# Patient Record
Sex: Female | Born: 1940 | Race: White | Hispanic: No | Marital: Married | Smoking: Former smoker
Health system: Southern US, Community
[De-identification: ages and names within clinical notes are randomized; demographics above are authoritative.]

## PROBLEM LIST (undated history)

## (undated) DIAGNOSIS — M4310 Spondylolisthesis, site unspecified: Secondary | ICD-10-CM

## (undated) DIAGNOSIS — F419 Anxiety disorder, unspecified: Secondary | ICD-10-CM

## (undated) DIAGNOSIS — M419 Scoliosis, unspecified: Secondary | ICD-10-CM

## (undated) DIAGNOSIS — K649 Unspecified hemorrhoids: Secondary | ICD-10-CM

## (undated) DIAGNOSIS — K449 Diaphragmatic hernia without obstruction or gangrene: Secondary | ICD-10-CM

## (undated) DIAGNOSIS — H269 Unspecified cataract: Secondary | ICD-10-CM

## (undated) DIAGNOSIS — K635 Polyp of colon: Secondary | ICD-10-CM

## (undated) DIAGNOSIS — M797 Fibromyalgia: Secondary | ICD-10-CM

## (undated) DIAGNOSIS — D126 Benign neoplasm of colon, unspecified: Secondary | ICD-10-CM

## (undated) DIAGNOSIS — T7840XA Allergy, unspecified, initial encounter: Secondary | ICD-10-CM

## (undated) DIAGNOSIS — M9901 Segmental and somatic dysfunction of cervical region: Secondary | ICD-10-CM

## (undated) DIAGNOSIS — M7750 Other enthesopathy of unspecified foot: Secondary | ICD-10-CM

## (undated) DIAGNOSIS — N3010 Interstitial cystitis (chronic) without hematuria: Secondary | ICD-10-CM

## (undated) DIAGNOSIS — E039 Hypothyroidism, unspecified: Secondary | ICD-10-CM

## (undated) DIAGNOSIS — K219 Gastro-esophageal reflux disease without esophagitis: Secondary | ICD-10-CM

## (undated) DIAGNOSIS — R002 Palpitations: Secondary | ICD-10-CM

## (undated) DIAGNOSIS — I10 Essential (primary) hypertension: Secondary | ICD-10-CM

## (undated) DIAGNOSIS — K589 Irritable bowel syndrome without diarrhea: Secondary | ICD-10-CM

## (undated) DIAGNOSIS — M810 Age-related osteoporosis without current pathological fracture: Secondary | ICD-10-CM

## (undated) DIAGNOSIS — E785 Hyperlipidemia, unspecified: Secondary | ICD-10-CM

## (undated) DIAGNOSIS — M479 Spondylosis, unspecified: Secondary | ICD-10-CM

## (undated) DIAGNOSIS — R000 Tachycardia, unspecified: Secondary | ICD-10-CM

## (undated) DIAGNOSIS — H8110 Benign paroxysmal vertigo, unspecified ear: Secondary | ICD-10-CM

## (undated) HISTORY — PX: DILATION AND CURETTAGE OF UTERUS: SHX78

## (undated) HISTORY — DX: Anxiety disorder, unspecified: F41.9

## (undated) HISTORY — PX: TUBAL LIGATION: SHX77

## (undated) HISTORY — DX: Polyp of colon: K63.5

## (undated) HISTORY — PX: VEIN SURGERY: SHX48

## (undated) HISTORY — DX: Spondylolisthesis, site unspecified: M43.10

## (undated) HISTORY — DX: Age-related osteoporosis without current pathological fracture: M81.0

## (undated) HISTORY — DX: Unspecified cataract: H26.9

## (undated) HISTORY — PX: UPPER GASTROINTESTINAL ENDOSCOPY: SHX188

## (undated) HISTORY — PX: POLYPECTOMY: SHX149

## (undated) HISTORY — DX: Palpitations: R00.2

## (undated) HISTORY — DX: Scoliosis, unspecified: M41.9

## (undated) HISTORY — DX: Diaphragmatic hernia without obstruction or gangrene: K44.9

## (undated) HISTORY — DX: Unspecified hemorrhoids: K64.9

## (undated) HISTORY — DX: Gastro-esophageal reflux disease without esophagitis: K21.9

## (undated) HISTORY — DX: Segmental and somatic dysfunction of cervical region: M99.01

## (undated) HISTORY — DX: Allergy, unspecified, initial encounter: T78.40XA

## (undated) HISTORY — DX: Irritable bowel syndrome, unspecified: K58.9

## (undated) HISTORY — DX: Other enthesopathy of unspecified foot and ankle: M77.50

## (undated) HISTORY — DX: Benign neoplasm of colon, unspecified: D12.6

## (undated) HISTORY — DX: Fibromyalgia: M79.7

## (undated) HISTORY — PX: TONSILLECTOMY: SUR1361

---

## 1998-02-17 ENCOUNTER — Emergency Department (HOSPITAL_COMMUNITY): Admission: EM | Admit: 1998-02-17 | Discharge: 1998-02-17 | Payer: Self-pay | Admitting: Emergency Medicine

## 1998-02-18 ENCOUNTER — Encounter: Payer: Self-pay | Admitting: Emergency Medicine

## 1998-08-31 ENCOUNTER — Other Ambulatory Visit: Admission: RE | Admit: 1998-08-31 | Discharge: 1998-08-31 | Payer: Self-pay | Admitting: Gynecology

## 1998-12-14 ENCOUNTER — Emergency Department (HOSPITAL_COMMUNITY): Admission: EM | Admit: 1998-12-14 | Discharge: 1998-12-14 | Payer: Self-pay | Admitting: Emergency Medicine

## 1999-09-03 ENCOUNTER — Other Ambulatory Visit: Admission: RE | Admit: 1999-09-03 | Discharge: 1999-09-03 | Payer: Self-pay | Admitting: Gynecology

## 2000-09-03 ENCOUNTER — Other Ambulatory Visit: Admission: RE | Admit: 2000-09-03 | Discharge: 2000-09-03 | Payer: Self-pay | Admitting: Gynecology

## 2001-01-28 ENCOUNTER — Other Ambulatory Visit: Admission: RE | Admit: 2001-01-28 | Discharge: 2001-01-28 | Payer: Self-pay | Admitting: Gynecology

## 2001-03-23 ENCOUNTER — Encounter: Payer: Self-pay | Admitting: Internal Medicine

## 2001-03-23 ENCOUNTER — Encounter (INDEPENDENT_AMBULATORY_CARE_PROVIDER_SITE_OTHER): Payer: Self-pay

## 2001-03-23 ENCOUNTER — Other Ambulatory Visit: Admission: RE | Admit: 2001-03-23 | Discharge: 2001-03-23 | Payer: Self-pay | Admitting: Internal Medicine

## 2001-09-07 ENCOUNTER — Other Ambulatory Visit: Admission: RE | Admit: 2001-09-07 | Discharge: 2001-09-07 | Payer: Self-pay | Admitting: Gynecology

## 2001-12-28 ENCOUNTER — Other Ambulatory Visit: Admission: RE | Admit: 2001-12-28 | Discharge: 2001-12-28 | Payer: Self-pay | Admitting: Gynecology

## 2002-09-15 ENCOUNTER — Other Ambulatory Visit: Admission: RE | Admit: 2002-09-15 | Discharge: 2002-09-15 | Payer: Self-pay | Admitting: Gynecology

## 2003-05-11 ENCOUNTER — Encounter: Payer: Self-pay | Admitting: Internal Medicine

## 2003-09-20 ENCOUNTER — Other Ambulatory Visit: Admission: RE | Admit: 2003-09-20 | Discharge: 2003-09-20 | Payer: Self-pay | Admitting: Gynecology

## 2004-09-20 ENCOUNTER — Other Ambulatory Visit: Admission: RE | Admit: 2004-09-20 | Discharge: 2004-09-20 | Payer: Self-pay | Admitting: Gynecology

## 2005-09-24 ENCOUNTER — Other Ambulatory Visit: Admission: RE | Admit: 2005-09-24 | Discharge: 2005-09-24 | Payer: Self-pay | Admitting: Gynecology

## 2006-05-08 ENCOUNTER — Ambulatory Visit: Payer: Self-pay | Admitting: Internal Medicine

## 2006-05-14 ENCOUNTER — Ambulatory Visit: Payer: Self-pay | Admitting: Internal Medicine

## 2006-10-08 ENCOUNTER — Other Ambulatory Visit: Admission: RE | Admit: 2006-10-08 | Discharge: 2006-10-08 | Payer: Self-pay | Admitting: Gynecology

## 2007-01-11 ENCOUNTER — Emergency Department (HOSPITAL_COMMUNITY): Admission: EM | Admit: 2007-01-11 | Discharge: 2007-01-11 | Payer: Self-pay | Admitting: Emergency Medicine

## 2007-10-12 ENCOUNTER — Other Ambulatory Visit: Admission: RE | Admit: 2007-10-12 | Discharge: 2007-10-12 | Payer: Self-pay | Admitting: Gynecology

## 2008-02-05 ENCOUNTER — Encounter: Payer: Self-pay | Admitting: Nurse Practitioner

## 2008-07-29 ENCOUNTER — Encounter: Payer: Self-pay | Admitting: Nurse Practitioner

## 2009-03-21 ENCOUNTER — Telehealth: Payer: Self-pay | Admitting: Internal Medicine

## 2009-03-22 ENCOUNTER — Ambulatory Visit: Payer: Self-pay | Admitting: Internal Medicine

## 2009-03-22 DIAGNOSIS — Z860100 Personal history of colon polyps, unspecified: Secondary | ICD-10-CM | POA: Insufficient documentation

## 2009-03-22 DIAGNOSIS — K219 Gastro-esophageal reflux disease without esophagitis: Secondary | ICD-10-CM

## 2009-03-27 ENCOUNTER — Telehealth: Payer: Self-pay | Admitting: Nurse Practitioner

## 2009-03-30 ENCOUNTER — Telehealth: Payer: Self-pay | Admitting: Internal Medicine

## 2009-03-30 ENCOUNTER — Encounter (INDEPENDENT_AMBULATORY_CARE_PROVIDER_SITE_OTHER): Payer: Self-pay | Admitting: *Deleted

## 2009-04-09 ENCOUNTER — Emergency Department (HOSPITAL_BASED_OUTPATIENT_CLINIC_OR_DEPARTMENT_OTHER): Admission: EM | Admit: 2009-04-09 | Discharge: 2009-04-09 | Payer: Self-pay | Admitting: Emergency Medicine

## 2009-04-25 ENCOUNTER — Emergency Department (HOSPITAL_COMMUNITY): Admission: EM | Admit: 2009-04-25 | Discharge: 2009-04-25 | Payer: Self-pay | Admitting: Emergency Medicine

## 2009-05-03 ENCOUNTER — Ambulatory Visit: Payer: Self-pay | Admitting: Internal Medicine

## 2009-05-03 DIAGNOSIS — R142 Eructation: Secondary | ICD-10-CM

## 2009-05-03 DIAGNOSIS — R141 Gas pain: Secondary | ICD-10-CM | POA: Insufficient documentation

## 2009-05-03 DIAGNOSIS — R143 Flatulence: Secondary | ICD-10-CM

## 2009-05-03 DIAGNOSIS — K589 Irritable bowel syndrome without diarrhea: Secondary | ICD-10-CM

## 2009-05-03 DIAGNOSIS — R1319 Other dysphagia: Secondary | ICD-10-CM

## 2009-05-05 LAB — CONVERTED CEMR LAB: Tissue Transglutaminase Ab, IgA: 0.3 units (ref <7)

## 2010-05-27 HISTORY — PX: COLONOSCOPY: SHX174

## 2010-08-29 LAB — CSF CELL COUNT WITH DIFFERENTIAL: WBC, CSF: 1 /mm3 (ref 0–5)

## 2010-11-25 ENCOUNTER — Emergency Department (HOSPITAL_BASED_OUTPATIENT_CLINIC_OR_DEPARTMENT_OTHER)
Admission: EM | Admit: 2010-11-25 | Discharge: 2010-11-25 | Disposition: A | Discharge status: Home / Self Care | Payer: Medicare Other | Attending: Emergency Medicine | Admitting: Emergency Medicine

## 2010-11-25 DIAGNOSIS — M810 Age-related osteoporosis without current pathological fracture: Secondary | ICD-10-CM | POA: Insufficient documentation

## 2010-11-25 DIAGNOSIS — I10 Essential (primary) hypertension: Secondary | ICD-10-CM | POA: Insufficient documentation

## 2010-11-25 DIAGNOSIS — K219 Gastro-esophageal reflux disease without esophagitis: Secondary | ICD-10-CM | POA: Insufficient documentation

## 2010-11-25 DIAGNOSIS — R002 Palpitations: Secondary | ICD-10-CM | POA: Insufficient documentation

## 2010-11-25 DIAGNOSIS — E7800 Pure hypercholesterolemia, unspecified: Secondary | ICD-10-CM | POA: Insufficient documentation

## 2010-11-25 DIAGNOSIS — Z79899 Other long term (current) drug therapy: Secondary | ICD-10-CM | POA: Insufficient documentation

## 2010-11-25 LAB — CBC
Hemoglobin: 12.4 g/dL (ref 12.0–15.0)
Platelets: 229 10*3/uL (ref 150–400)
RBC: 4.35 MIL/uL (ref 3.87–5.11)

## 2010-11-25 LAB — BASIC METABOLIC PANEL
CO2: 27 mEq/L (ref 19–32)
GFR calc non Af Amer: >60 mL/min (ref >60)
Glucose, Bld: 119 mg/dL — ABNORMAL HIGH (ref 70–99)
Potassium: 4.2 mEq/L (ref 3.5–5.1)
Sodium: 142 mEq/L (ref 135–145)

## 2010-11-26 LAB — TSH: TSH: 9.101 u[IU]/mL — ABNORMAL HIGH (ref 0.350–4.500)

## 2010-11-26 LAB — T4, FREE: Free T4: 0.82 ng/dL (ref 0.80–1.80)

## 2011-03-02 ENCOUNTER — Encounter: Payer: Self-pay | Admitting: Emergency Medicine

## 2011-03-02 ENCOUNTER — Other Ambulatory Visit: Payer: Self-pay

## 2011-03-02 ENCOUNTER — Emergency Department (INDEPENDENT_AMBULATORY_CARE_PROVIDER_SITE_OTHER): Payer: Medicare Other

## 2011-03-02 ENCOUNTER — Emergency Department (HOSPITAL_BASED_OUTPATIENT_CLINIC_OR_DEPARTMENT_OTHER)
Admission: EM | Admit: 2011-03-02 | Discharge: 2011-03-02 | Disposition: A | Discharge status: Other Acute Inpatient Hospital | Payer: Medicare Other | Attending: Emergency Medicine | Admitting: Emergency Medicine

## 2011-03-02 DIAGNOSIS — Z79899 Other long term (current) drug therapy: Secondary | ICD-10-CM | POA: Insufficient documentation

## 2011-03-02 DIAGNOSIS — R079 Chest pain, unspecified: Secondary | ICD-10-CM | POA: Insufficient documentation

## 2011-03-02 DIAGNOSIS — E039 Hypothyroidism, unspecified: Secondary | ICD-10-CM | POA: Insufficient documentation

## 2011-03-02 DIAGNOSIS — I10 Essential (primary) hypertension: Secondary | ICD-10-CM | POA: Insufficient documentation

## 2011-03-02 DIAGNOSIS — E785 Hyperlipidemia, unspecified: Secondary | ICD-10-CM | POA: Insufficient documentation

## 2011-03-02 HISTORY — DX: Essential (primary) hypertension: I10

## 2011-03-02 HISTORY — DX: Hyperlipidemia, unspecified: E78.5

## 2011-03-02 HISTORY — DX: Hypothyroidism, unspecified: E03.9

## 2011-03-02 HISTORY — DX: Tachycardia, unspecified: R00.0

## 2011-03-02 LAB — COMPREHENSIVE METABOLIC PANEL
ALT: 13 U/L (ref 0–35)
AST: 18 U/L (ref 0–37)
Albumin: 3.8 g/dL (ref 3.5–5.2)
Alkaline Phosphatase: 94 U/L (ref 39–117)
Calcium: 9.8 mg/dL (ref 8.4–10.5)
GFR calc Af Amer: >90 mL/min (ref >90)
Glucose, Bld: 110 mg/dL — ABNORMAL HIGH (ref 70–99)
Potassium: 4.2 mEq/L (ref 3.5–5.1)
Sodium: 139 mEq/L (ref 135–145)
Total Protein: 7.1 g/dL (ref 6.0–8.3)

## 2011-03-02 LAB — CBC
HCT: 38.9 % (ref 36.0–46.0)
RDW: 13.5 % (ref 11.5–15.5)
WBC: 6.3 10*3/uL (ref 4.0–10.5)

## 2011-03-02 LAB — TROPONIN I: Troponin I: <0.3 ng/mL (ref <0.30)

## 2011-03-02 MED ORDER — ENOXAPARIN SODIUM 150 MG/ML ~~LOC~~ SOLN
1.0000 mg/kg | Freq: Two times a day (BID) | SUBCUTANEOUS | Status: DC
  Administered 2011-03-02: 75 mg via SUBCUTANEOUS

## 2011-03-02 MED ORDER — NITROGLYCERIN 0.4 MG SL SUBL
0.4000 mg | SUBLINGUAL_TABLET | SUBLINGUAL | Status: DC | PRN
  Administered 2011-03-02 (×2): 0.4 mg via SUBLINGUAL
  Filled 2011-03-02: qty 25

## 2011-03-02 MED ORDER — ENOXAPARIN SODIUM 150 MG/ML ~~LOC~~ SOLN
SUBCUTANEOUS | Status: AC
  Filled 2011-03-02: qty 1

## 2011-03-02 NOTE — ED Notes (Signed)
MD at bedside. 

## 2011-03-02 NOTE — ED Notes (Signed)
Pt Reports chest pain last PM with tightness under scapula 3/10

## 2011-03-02 NOTE — ED Provider Notes (Signed)
History     CSN: [id number] Arrival date & time: 03/02/2011  8:33 AM  Chief Complaint  Patient presents with  . Chest Pain    chest pain and tightness starting at 10pm last night    (Consider location/radiation/quality/duration/timing/severity/associated sxs/prior treatment) Patient is a 70 y.o. female presenting with chest pain. The history is provided by the patient.  Chest Pain Episode onset: 4 hours ago. Chest pain occurs constantly. The chest pain is unchanged. Associated with: nothing. The severity of the pain is mild. The quality of the pain is described as tightness. Radiates to: And begins in her bilateral scapular and radiates around her bilateral chest. Exacerbated by: Nothing. She tried aspirin for the symptoms. Risk factors: Hypertension and hyperlipidemia as well as father with massive MI in his 26s. Procedure history comments: History of nuclear stress test approximately 18 months ago it was normal per patient.    patient reports waking with bilateral scapular tightness tingling in her bilateral jaws mild shortness of breath and diaphoresis.  She reports her symptoms are improving.  She took an aspirin approximately one hour ago.  She's never had symptoms like this before.  Does report some transient tightness that lasted for seconds in her superior chest last night associated with dancing at a party  Past Medical History  Diagnosis Date  . Tachycardia   . Hypertension   . Hyperlipidemia   . Hypothyroid     Past Surgical History  Procedure Date  . Tonsillectomy   . Tubal ligation     History reviewed. No pertinent family history.  History  Substance Use Topics  . Smoking status: Never Smoker   . Smokeless tobacco: Not on file  . Alcohol Use: 1.8 oz/week    3 Glasses of wine per week    OB History    Grav Para Term Preterm Abortions TAB SAB Ect Mult Living                  Review of Systems  Cardiovascular: Positive for chest pain.  All other systems  reviewed and are negative.    Allergies  Iodine; Statins; and Sulfonamide derivatives  Home Medications   Current Outpatient Rx  Name Route Sig Dispense Refill  . ASPIRIN 325 MG PO TABS Oral Take 325 mg by mouth daily.      Marland Kitchen DIAZEPAM 5 MG PO TABS Oral Take 5 mg by mouth every 6 (six) hours as needed.      Marland Kitchen EZETIMIBE 10 MG PO TABS Oral Take 10 mg by mouth daily.      Marland Kitchen LISINOPRIL 10 MG PO TABS Oral Take 10 mg by mouth daily.      Marland Kitchen METOPROLOL SUCCINATE 50 MG PO TB24 Oral Take 50 mg by mouth daily.      Marland Kitchen VALACYCLOVIR HCL 1 G PO TABS Oral Take 1,000 mg by mouth 2 (two) times daily.        BP 156/72  Pulse 80  Temp(Src) 98.6 F (37 C) (Oral)  Resp 20  SpO2 100%  Physical Exam  Nursing note and vitals reviewed. Constitutional: She is oriented to person, place, and time. She appears well-developed and well-nourished. No distress.  HENT:  Head: Normocephalic and atraumatic.  Eyes: EOM are normal.  Neck: Normal range of motion.  Cardiovascular: Normal rate, regular rhythm and normal heart sounds.   Pulmonary/Chest: Effort normal and breath sounds normal.  Abdominal: Soft. She exhibits no distension. There is no tenderness.  Musculoskeletal: Normal range of motion.  Neurological: She is alert and oriented to person, place, and time.  Skin: Skin is warm and dry.  Psychiatric: She has a normal mood and affect. Judgment normal.    ED Course  Procedures (including critical care time)  Labs Reviewed  COMPREHENSIVE METABOLIC PANEL - Abnormal; Notable for the following:    Glucose, Bld 110 (*)    GFR calc non Af Amer 87 (*)    All other components within normal limits  CBC  TROPONIN I   Dg Chest 2 View  03/02/2011  *RADIOLOGY REPORT*  Clinical Data: Chest pain  CHEST - 2 VIEW  Comparison: None  Findings: The heart size and mediastinal contours are within normal limits.  Both lungs are clear.  The visualized skeletal structures are unremarkable.  IMPRESSION: No active  cardiopulmonary abnormalities.  Original Report Authenticated By: Rosealee Albee, M.D.     1. Chest pain      Date: 03/02/2011  Rate: 73  Rhythm: normal sinus rhythm  QRS Axis: normal  Intervals: normal  ST/T Wave abnormalities: normal  Conduction Disutrbances:none  Narrative Interpretation: occasional supraventricular complexes  Old EKG Reviewed: unchanged    MDM  Patient is 41 with some cardiac risk factors.  She has some typical and some atypical components to her discomfort.  Chest x-ray labs pending.  Aspirin prior to arrival.  Will do nitroglycerin treatment at this time.  Doubt pulmonary embolism.  Doubt aortic dissection.   10:04 AM Spoke with Dr Desma Maxim, [facility] cards. Asks for the pt to be admitted to the [facility] hospitalist service. Resolution of her pain after the 2nd nitroglycerin.   10:29 AM Spoke with [facility] hospitalist who accepts the pt in transfer   Lyanne Co, MD 03/02/11 1031

## 2011-03-08 LAB — I-STAT 8, (EC8 V) (CONVERTED LAB)
Chloride: 104
HCT: 43
Hemoglobin: 14.6
Operator id: 161631
Potassium: 3.9
Sodium: 138
TCO2: 28

## 2011-03-08 LAB — POCT CARDIAC MARKERS
CKMB, poc: <1 — ABNORMAL LOW
Troponin i, poc: <0.05

## 2011-03-08 LAB — POCT I-STAT CREATININE
Creatinine, Ser: 0.8
Operator id: 161631

## 2011-04-23 ENCOUNTER — Ambulatory Visit (AMBULATORY_SURGERY_CENTER): Payer: Medicare Other

## 2011-04-23 VITALS — Ht 64.0 in | Wt 166.7 lb

## 2011-04-23 DIAGNOSIS — Z1211 Encounter for screening for malignant neoplasm of colon: Secondary | ICD-10-CM

## 2011-04-23 DIAGNOSIS — Z86010 Personal history of colonic polyps: Secondary | ICD-10-CM

## 2011-04-23 MED ORDER — PEG-KCL-NACL-NASULF-NA ASC-C 100 G PO SOLR: 1.0000 | Freq: Once | ORAL | Status: AC

## 2011-05-07 ENCOUNTER — Ambulatory Visit (AMBULATORY_SURGERY_CENTER): Payer: Medicare Other | Admitting: Internal Medicine

## 2011-05-07 ENCOUNTER — Encounter: Payer: Self-pay | Admitting: Internal Medicine

## 2011-05-07 VITALS — BP 163/65 | HR 67 | Temp 97.5°F | Resp 16 | Ht 64.0 in | Wt 166.0 lb

## 2011-05-07 DIAGNOSIS — Z86010 Personal history of colonic polyps: Secondary | ICD-10-CM

## 2011-05-07 DIAGNOSIS — D126 Benign neoplasm of colon, unspecified: Secondary | ICD-10-CM

## 2011-05-07 DIAGNOSIS — Z1211 Encounter for screening for malignant neoplasm of colon: Secondary | ICD-10-CM

## 2011-05-07 MED ORDER — SODIUM CHLORIDE 0.9 % IV SOLN: 500.0000 mL | INTRAVENOUS | Status: DC

## 2011-05-07 NOTE — Progress Notes (Signed)
Patient did not experience any of the following events: a burn prior to discharge; a fall within the facility; wrong site/side/patient/procedure/implant event; or a hospital transfer or hospital admission upon discharge from the facility. (G8907) Patient did not have preoperative order for IV antibiotic SSI prophylaxis. (G8918)  

## 2011-05-07 NOTE — Op Note (Signed)
[facility] [address] [place], [place]  [address]  COLONOSCOPY PROCEDURE REPORT  PATIENT:  Dominique, Gillespie  MR#:  [id number] BIRTHDATE:  03/17/1941, 70 yrs. old  GENDER:  female ENDOSCOPIST:  Wilhemina Bonito. Eda Keys, MD REF. BY:  Surveillance Program Recall, PROCEDURE DATE:  05/07/2011 PROCEDURE:  Colonoscopy with snare polypectomy x 1 ASA CLASS:  Class II INDICATIONS:  history of pre-cancerous (adenomatous) colon polyps, surveillance and high-risk screening ; prior exams 2002, 04, 07 (TAs) MEDICATIONS:   Fentanyl 100 mcg IV, Versed 8 mg IV, Benadryl 25mg  IV.These medications were titrated to patient response per physician's verbal order  DESCRIPTION OF PROCEDURE:   After the risks benefits and alternatives of the procedure were thoroughly explained, informed consent was obtained.  Digital rectal exam was performed and revealed no abnormalities.   The LB 180AL [id number] endoscope was introduced through the anus and advanced to the cecum, which was identified by both the appendix and ileocecal valve, without limitations.  The quality of the prep was excellent, using MoviPrep.  The instrument was then slowly withdrawn as the colon was fully examined. <<PROCEDUREIMAGES>>  FINDINGS:  A diminutive polyp was found at the ileocecal valve and snared without cautery. Retrieval was successful.  Otherwise normal colonoscopy without other polyps, masses, vascular ectasias, or inflammatory changes.   Retroflexed views in the rectum revealed internal hemorrhoids.    The time to cecum = 4:08 minutes. The scope was then withdrawn in  8:30  minutes from the cecum and the procedure completed.  COMPLICATIONS:  None  ENDOSCOPIC IMPRESSION: 1) Diminutive polyp at the ileocecal valve - removed 2) Otherwise normal colonoscopy 3) Small Internal hemorrhoids  RECOMMENDATIONS: 1) Follow up colonoscopy in 5 years  ______________________________ Wilhemina Bonito. Eda Keys, MD  CC:  The  Patient  n. eSIGNED:   Wilhemina Bonito. Eda Keys at 05/07/2011 09:48 AM  Balinda Quails, [phone]

## 2011-05-08 ENCOUNTER — Telehealth: Payer: Self-pay | Admitting: *Deleted

## 2011-05-08 NOTE — Telephone Encounter (Signed)

## 2012-11-30 ENCOUNTER — Encounter: Payer: Self-pay | Admitting: *Deleted

## 2012-11-30 ENCOUNTER — Telehealth: Payer: Self-pay | Admitting: Internal Medicine

## 2012-11-30 NOTE — Telephone Encounter (Signed)
Pt states she has been having problems with diarrhea alternating with constipation. States she is having a lot of gas and bloating. Pt scheduled to see Willette Cluster NP tomorrow at 2pm. Pt aware of appt date and time.

## 2012-11-30 NOTE — Telephone Encounter (Signed)
Left message for pt to call back  °

## 2012-12-01 ENCOUNTER — Encounter: Payer: Self-pay | Admitting: Nurse Practitioner

## 2012-12-01 ENCOUNTER — Ambulatory Visit (INDEPENDENT_AMBULATORY_CARE_PROVIDER_SITE_OTHER): Payer: Medicare Other | Admitting: Nurse Practitioner

## 2012-12-01 VITALS — BP 132/66 | HR 65 | Ht 64.0 in | Wt 175.0 lb

## 2012-12-01 DIAGNOSIS — R197 Diarrhea, unspecified: Secondary | ICD-10-CM

## 2012-12-01 NOTE — Patient Instructions (Addendum)
Please go to the basement level to our lab for a stool study.  They will give you the cups to take home with directions.  You can do this if you need to. If so, bring the cup back to our lab and we will call you with the results.

## 2012-12-02 ENCOUNTER — Encounter: Payer: Self-pay | Admitting: Nurse Practitioner

## 2012-12-02 DIAGNOSIS — R197 Diarrhea, unspecified: Secondary | ICD-10-CM | POA: Insufficient documentation

## 2012-12-02 NOTE — Progress Notes (Signed)
Agree with initial assessment and plans as outlined 

## 2012-12-02 NOTE — Progress Notes (Signed)
HPI :   Patient is a 72 year old female known to Dr. Marina Goodell for history of adenomatous colon polyps and GERD. Patient has not been seen here since 2010 when she presented with bloating and gas felt to be related to dietary changes.  Patient presents with recent bowel changes after returning from a trip to [place]. No GI problems while in [place] but then she developed bloating and crampy diarrhea a week later. While in [place] she did develop a sinus infection and took a course of Omnicef.  Since making this appointment her GI symptoms have significantly improved. No further cramps, stools forming. She never had any rectal bleeding, nausea, or fevers.   Past Medical History  Diagnosis Date  . Tachycardia   . Hypertension   . Hyperlipidemia   . Hypothyroid   . Tendonitis of foot   . Palpitations   . Tubulovillous adenoma polyp of colon    Family History  Problem Relation Age of Onset  . Breast cancer Mother   . Heart disease Father    History  Substance Use Topics  . Smoking status: Former Smoker    Types: Cigarettes    Quit date: 04/23/1959  . Smokeless tobacco: Never Used  . Alcohol Use: 1.2 oz/week    2 Glasses of wine per week   Current Outpatient Prescriptions  Medication Sig Dispense Refill  . aspirin 81 MG tablet Take 81 mg by mouth daily.        . cholecalciferol (VITAMIN D) 1000 UNITS tablet Take 1,000 Units by mouth daily. Take 1000 units SL daily       . co-enzyme Q-10 30 MG capsule Take 30 mg by mouth daily.        . diazepam (VALIUM) 5 MG tablet Take 5 mg by mouth every 6 (six) hours as needed.        . ezetimibe (ZETIA) 10 MG tablet Take 10 mg by mouth daily.        . fexofenadine (ALLEGRA) 180 MG tablet Take 180 mg by mouth daily.        . fish oil-omega-3 fatty acids 1000 MG capsule Take 1 g by mouth daily.        . fluticasone (VERAMYST) 27.5 MCG/SPRAY nasal spray Place 2 sprays into the nose daily.      . lansoprazole (PREVACID) 30 MG capsule Take 30 mg  by mouth daily.        Marland Kitchen levothyroxine (SYNTHROID, LEVOTHROID) 100 MCG tablet Take 100 mcg by mouth daily before breakfast.      . levothyroxine (SYNTHROID, LEVOTHROID) 75 MCG tablet Take 75 mcg by mouth daily before breakfast.      . lisinopril (PRINIVIL,ZESTRIL) 10 MG tablet Take 10 mg by mouth daily.        Marland Kitchen METOPROLOL TARTRATE PO Take by mouth 2 (two) times daily. Take 50 mg bid       . Red Yeast Rice Extract (RED YEAST RICE PO) Take by mouth. Beyond Red Yeast Rice 2 tabs daily       . valACYclovir (VALTREX) 1000 MG tablet Take 1,000 mg by mouth daily.        No current facility-administered medications for this visit.   Allergies  Allergen Reactions  . Epinephrine Base Shortness Of Breath and Swelling  . Mepivacaine Hcl Hives  . Eggs Or Egg-Derived Products Swelling  . Iodine     IV contrast  . Statins   . Sulfonamide Derivatives     Review of Systems:  All systems reviewed and negative except where noted in HPI.    Physical Exam: BP 132/66  Pulse 65  Ht 5\' 4"  (1.626 m)  Wt 175 lb (79.379 kg)  BMI 30.02 kg/m2 Constitutional: Pleasant,well-developed, white female in no acute distress. HEENT: Normocephalic and atraumatic. Conjunctivae are normal. No scleral icterus. Neck supple.  Cardiovascular: Normal rate, regular rhythm.  Pulmonary/chest: Effort normal and breath sounds normal. No wheezing, rales or rhonchi. Abdominal: Soft, nondistended, nontender. Bowel sounds active throughout. There are no masses palpable. No hepatomegaly. Extremities: no edema Lymphadenopathy: No cervical adenopathy noted. Neurological: Alert and oriented to person place and time. Skin: Skin is warm and dry. No rashes noted. Psychiatric: Normal mood and affect. Behavior is normal.   ASSESSMENT AND PLAN: 58. 72 year old female with recent diarrheal illness after returning from trip to [place]. Symptoms spontaneously resolved. She looks quite well. Patient did have course of antibiotics on  the trip. We discussed need for stool studies to rule out c-diff should diarrhea recur.  2. history of adenomatous colon polyps, surveillance colonoscopy due 2017

## 2013-03-30 ENCOUNTER — Telehealth: Payer: Self-pay | Admitting: *Deleted

## 2013-03-30 NOTE — Telephone Encounter (Signed)
Copy of labs mailed to pt's home address per her request.  Pt to f/u with her PCP concerning elevated cholesterol.

## 2013-06-03 ENCOUNTER — Encounter (HOSPITAL_BASED_OUTPATIENT_CLINIC_OR_DEPARTMENT_OTHER): Payer: Self-pay | Admitting: Emergency Medicine

## 2013-06-03 ENCOUNTER — Emergency Department (HOSPITAL_BASED_OUTPATIENT_CLINIC_OR_DEPARTMENT_OTHER)
Admission: EM | Admit: 2013-06-03 | Discharge: 2013-06-03 | Disposition: A | Discharge status: Home / Self Care | Payer: Medicare Other | Attending: Emergency Medicine | Admitting: Emergency Medicine

## 2013-06-03 ENCOUNTER — Emergency Department (HOSPITAL_BASED_OUTPATIENT_CLINIC_OR_DEPARTMENT_OTHER): Payer: Medicare Other

## 2013-06-03 DIAGNOSIS — Z87891 Personal history of nicotine dependence: Secondary | ICD-10-CM | POA: Insufficient documentation

## 2013-06-03 DIAGNOSIS — E785 Hyperlipidemia, unspecified: Secondary | ICD-10-CM | POA: Insufficient documentation

## 2013-06-03 DIAGNOSIS — E039 Hypothyroidism, unspecified: Secondary | ICD-10-CM | POA: Insufficient documentation

## 2013-06-03 DIAGNOSIS — I10 Essential (primary) hypertension: Secondary | ICD-10-CM | POA: Insufficient documentation

## 2013-06-03 DIAGNOSIS — R110 Nausea: Secondary | ICD-10-CM | POA: Insufficient documentation

## 2013-06-03 DIAGNOSIS — Z79899 Other long term (current) drug therapy: Secondary | ICD-10-CM | POA: Insufficient documentation

## 2013-06-03 DIAGNOSIS — Z87448 Personal history of other diseases of urinary system: Secondary | ICD-10-CM | POA: Insufficient documentation

## 2013-06-03 DIAGNOSIS — Z7982 Long term (current) use of aspirin: Secondary | ICD-10-CM | POA: Insufficient documentation

## 2013-06-03 DIAGNOSIS — Z860100 Personal history of colon polyps, unspecified: Secondary | ICD-10-CM | POA: Insufficient documentation

## 2013-06-03 DIAGNOSIS — Z9851 Tubal ligation status: Secondary | ICD-10-CM | POA: Insufficient documentation

## 2013-06-03 DIAGNOSIS — R1011 Right upper quadrant pain: Secondary | ICD-10-CM | POA: Insufficient documentation

## 2013-06-03 DIAGNOSIS — IMO0002 Reserved for concepts with insufficient information to code with codable children: Secondary | ICD-10-CM | POA: Insufficient documentation

## 2013-06-03 DIAGNOSIS — R109 Unspecified abdominal pain: Secondary | ICD-10-CM

## 2013-06-03 DIAGNOSIS — Z8739 Personal history of other diseases of the musculoskeletal system and connective tissue: Secondary | ICD-10-CM | POA: Insufficient documentation

## 2013-06-03 HISTORY — DX: Spondylosis, unspecified: M47.9

## 2013-06-03 HISTORY — DX: Interstitial cystitis (chronic) without hematuria: N30.10

## 2013-06-03 LAB — URINALYSIS, ROUTINE W REFLEX MICROSCOPIC
Bilirubin Urine: NEGATIVE
GLUCOSE, UA: NEGATIVE mg/dL
Hgb urine dipstick: NEGATIVE
KETONES UR: NEGATIVE mg/dL
LEUKOCYTES UA: NEGATIVE
NITRITE: NEGATIVE
PH: 7 (ref 5.0–8.0)
Protein, ur: NEGATIVE mg/dL
Specific Gravity, Urine: 1.017 (ref 1.005–1.030)
Urobilinogen, UA: 0.2 mg/dL (ref 0.0–1.0)

## 2013-06-03 LAB — CBC WITH DIFFERENTIAL/PLATELET
Basophils Absolute: 0 10*3/uL (ref 0.0–0.1)
Basophils Relative: 1 % (ref 0–1)
EOS ABS: 0.2 10*3/uL (ref 0.0–0.7)
Eosinophils Relative: 3 % (ref 0–5)
HCT: 38.1 % (ref 36.0–46.0)
Hemoglobin: 12.6 g/dL (ref 12.0–15.0)
Lymphocytes Relative: 9 % — ABNORMAL LOW (ref 12–46)
Lymphs Abs: 0.5 10*3/uL — ABNORMAL LOW (ref 0.7–4.0)
MCH: 28.8 pg (ref 26.0–34.0)
MCHC: 33.1 g/dL (ref 30.0–36.0)
MCV: 87.2 fL (ref 78.0–100.0)
MONOS PCT: 11 % (ref 3–12)
Monocytes Absolute: 0.6 10*3/uL (ref 0.1–1.0)
Neutro Abs: 3.8 10*3/uL (ref 1.7–7.7)
Neutrophils Relative %: 76 % (ref 43–77)
Platelets: 237 10*3/uL (ref 150–400)
RBC: 4.37 MIL/uL (ref 3.87–5.11)
RDW: 14.3 % (ref 11.5–15.5)
WBC: 5 10*3/uL (ref 4.0–10.5)

## 2013-06-03 LAB — COMPREHENSIVE METABOLIC PANEL
ALK PHOS: 97 U/L (ref 39–117)
ALT: 32 U/L (ref 0–35)
AST: 29 U/L (ref 0–37)
Albumin: 3.7 g/dL (ref 3.5–5.2)
BUN: 10 mg/dL (ref 6–23)
CO2: 26 mEq/L (ref 19–32)
Calcium: 9.2 mg/dL (ref 8.4–10.5)
Chloride: 99 mEq/L (ref 96–112)
Creatinine, Ser: 0.8 mg/dL (ref 0.50–1.10)
GFR, EST AFRICAN AMERICAN: 84 mL/min — AB (ref >90)
GFR, EST NON AFRICAN AMERICAN: 72 mL/min — AB (ref >90)
GLUCOSE: 108 mg/dL — AB (ref 70–99)
Potassium: 4.5 mEq/L (ref 3.7–5.3)
Sodium: 137 mEq/L (ref 137–147)
TOTAL PROTEIN: 7.3 g/dL (ref 6.0–8.3)
Total Bilirubin: 0.3 mg/dL (ref 0.3–1.2)

## 2013-06-03 LAB — LIPASE, BLOOD: LIPASE: 26 U/L (ref 11–59)

## 2013-06-03 NOTE — ED Notes (Signed)
MD at bedside. 

## 2013-06-03 NOTE — ED Provider Notes (Signed)
CSN: [id number]     Arrival date & time 06/03/13  1057 History   First MD Initiated Contact with Patient 06/03/13 1114     Chief Complaint  Patient presents with  . Abdominal Pain  . Nausea   (Consider location/radiation/quality/duration/timing/severity/associated sxs/prior Treatment) Patient is a 73 y.o. female presenting with abdominal pain.  Abdominal Pain  Pt reports at about 9:55am today she had sudden onset of severe sharp RUQ pain, associated with nausea. Onset was about an hour after she ate breakfast. Pain was severe for about 4min then gradually improved. She took a Valium while having pain to see if it would help. She had a normal BM earlier this morning. Felt like she needed to have another BM while pain was severe but was unable to go. She has never had similar pain before.   Past Medical History  Diagnosis Date  . Tachycardia   . Hypertension   . Hyperlipidemia   . Hypothyroid   . Tendonitis of foot   . Palpitations   . Tubulovillous adenoma polyp of colon   . Interstitial cystitis   . Spondylarthrosis    Past Surgical History  Procedure Laterality Date  . Tonsillectomy    . Tubal ligation    . Colonoscopy     Family History  Problem Relation Age of Onset  . Breast cancer Mother   . Heart disease Father    History  Substance Use Topics  . Smoking status: Former Smoker    Types: Cigarettes    Quit date: 04/23/1959  . Smokeless tobacco: Never Used  . Alcohol Use: 1.2 oz/week    2 Glasses of wine per week     Comment: occasional   OB History   Grav Para Term Preterm Abortions TAB SAB Ect Mult Living                 Review of Systems  Gastrointestinal: Positive for abdominal pain.   All other systems reviewed and are negative except as noted in HPI.   Allergies  Epinephrine base; Mepivacaine hcl; Eggs or egg-derived products; Iodine; Statins; and Sulfonamide derivatives  Home Medications   Current Outpatient Rx  Name  Route  Sig  Dispense   Refill  . aspirin 81 MG tablet   Oral   Take 81 mg by mouth daily.           . cholecalciferol (VITAMIN D) 1000 UNITS tablet   Oral   Take 1,000 Units by mouth daily. Take 1000 units SL daily          . co-enzyme Q-10 30 MG capsule   Oral   Take 30 mg by mouth daily.           . diazepam (VALIUM) 5 MG tablet   Oral   Take 5 mg by mouth every 6 (six) hours as needed.           . ezetimibe (ZETIA) 10 MG tablet   Oral   Take 10 mg by mouth daily.           . fexofenadine (ALLEGRA) 180 MG tablet   Oral   Take 180 mg by mouth daily.           . fish oil-omega-3 fatty acids 1000 MG capsule   Oral   Take 1 g by mouth daily.           . fluticasone (VERAMYST) 27.5 MCG/SPRAY nasal spray   Nasal   Place 2 sprays into the  nose daily.         . lansoprazole (PREVACID) 30 MG capsule   Oral   Take 30 mg by mouth daily.           Marland Kitchen levothyroxine (SYNTHROID, LEVOTHROID) 100 MCG tablet   Oral   Take 100 mcg by mouth daily before breakfast.         . levothyroxine (SYNTHROID, LEVOTHROID) 75 MCG tablet   Oral   Take 75 mcg by mouth daily before breakfast.         . lisinopril (PRINIVIL,ZESTRIL) 10 MG tablet   Oral   Take 10 mg by mouth daily.           Marland Kitchen METOPROLOL TARTRATE PO   Oral   Take by mouth 2 (two) times daily. Take 50 mg bid          . Red Yeast Rice Extract (RED YEAST RICE PO)   Oral   Take by mouth. Beyond Red Yeast Rice 2 tabs daily          . valACYclovir (VALTREX) 1000 MG tablet   Oral   Take 1,000 mg by mouth daily.           There were no vitals taken for this visit. Physical Exam  Nursing note and vitals reviewed. Constitutional: She is oriented to person, place, and time. She appears well-developed and well-nourished.  HENT:  Head: Normocephalic and atraumatic.  Eyes: EOM are normal. Pupils are equal, round, and reactive to light.  Neck: Normal range of motion. Neck supple.  Cardiovascular: Normal rate, normal heart  sounds and intact distal pulses.   Pulmonary/Chest: Effort normal and breath sounds normal.  Abdominal: Bowel sounds are normal. She exhibits no distension. There is tenderness (mild RUQ, neg Murphy's sign). There is no rebound and no guarding.  Musculoskeletal: Normal range of motion. She exhibits no edema and no tenderness.  Neurological: She is alert and oriented to person, place, and time. She has normal strength. No cranial nerve deficit or sensory deficit.  Skin: Skin is warm and dry. No rash noted.  Psychiatric: She has a normal mood and affect.    ED Course  Procedures (including critical care time) Labs Review Labs Reviewed  CBC WITH DIFFERENTIAL - Abnormal; Notable for the following:    Lymphocytes Relative 9 (*)    Lymphs Abs 0.5 (*)    All other components within normal limits  URINALYSIS, ROUTINE W REFLEX MICROSCOPIC  COMPREHENSIVE METABOLIC PANEL  LIPASE, BLOOD   Imaging Review No results found.  EKG Interpretation    Date/Time:  Thursday June 03 2013 11:04:40 EST Ventricular Rate:  85 PR Interval:  150 QRS Duration: 74 QT Interval:  330 QTC Calculation: 392 R Axis:   77 Text Interpretation:  Sinus rhythm with Premature atrial complexes Otherwise normal ECG No significant change since last tracing Confirmed by Dyonna Jaspers  MD, Daphene Chisholm ([id number]) on 06/03/2013 11:06:23 AM            MDM   1. Abdominal pain     Pt remains pain free, labs and [place] neg. Suspect gas pains. Advised to return for worsening.    Adriona Kaney B. Karle Starch, MD 06/03/13 1308

## 2013-06-03 NOTE — ED Notes (Addendum)
Pt reports severe RUQ pain radiating "under ribs" and nausea.  Onset 1020 am States she took Valium PTA.

## 2013-06-03 NOTE — Discharge Instructions (Signed)

## 2013-06-09 ENCOUNTER — Encounter: Payer: Self-pay | Admitting: Internal Medicine

## 2013-07-05 ENCOUNTER — Encounter: Payer: Self-pay | Admitting: Internal Medicine

## 2013-07-05 ENCOUNTER — Ambulatory Visit (INDEPENDENT_AMBULATORY_CARE_PROVIDER_SITE_OTHER): Payer: Medicare Other | Admitting: Internal Medicine

## 2013-07-05 VITALS — BP 132/80 | HR 76 | Ht 64.0 in | Wt 168.2 lb

## 2013-07-05 DIAGNOSIS — R198 Other specified symptoms and signs involving the digestive system and abdomen: Secondary | ICD-10-CM

## 2013-07-05 DIAGNOSIS — K589 Irritable bowel syndrome without diarrhea: Secondary | ICD-10-CM

## 2013-07-05 DIAGNOSIS — K219 Gastro-esophageal reflux disease without esophagitis: Secondary | ICD-10-CM

## 2013-07-05 DIAGNOSIS — R1915 Other abnormal bowel sounds: Secondary | ICD-10-CM

## 2013-07-05 DIAGNOSIS — R1011 Right upper quadrant pain: Secondary | ICD-10-CM

## 2013-07-05 NOTE — Progress Notes (Signed)
HISTORY OF PRESENT ILLNESS:  Dominique Gillespie is a 73 y.o. female with multiple medical problems as listed below. She is followed in this office for GERD, adenomatous colon polyps, and IBS. She refers herself today after having an episode of right-sided abdominal pain. The patient has been having respiratory complaints and URI symptoms with cough. Subsequently developed severe right upper quadrant pain for which she went to the emergency room. Laboratories and abdominal ultrasound were negative (reviewed). Her problem dissipated relatively quickly. She was felt to have increased intestinal gas or possibly musculoskeletal pain. No recurrence. No difficulties with bowel habits or evidence of bleeding. No fevers. Current complaint is that of increased bowel sounds or borborygmi. No pain. Also, somewhat of a dry throat which makes swallowing pills difficult. However, she takes water in front of her pills, difficulty. No active reflux symptoms on lansoprazole. Significant complaints of stress related to the care of her husband. She remains in bulb with her church. Appetite and weight are stable. Last upper endoscopy 2002 with mild reflux changes and small hiatal hernia. Last colonoscopy 2012 with diminutive polyp.  REVIEW OF SYSTEMS:  All non-GI ROS negative except for sinus and allergy trouble, anxiety, back pain, sore throat Past Medical History  Diagnosis Date  . Tachycardia   . Hypertension   . Hyperlipidemia   . Hypothyroid   . Tendonitis of foot   . Palpitations   . Tubulovillous adenoma polyp of colon   . Interstitial cystitis   . Spondylarthrosis   . GERD (gastroesophageal reflux disease)   . Colon polyps     adenomatous  . IBS (irritable bowel syndrome)   . Hemorrhoids   . Hiatal hernia   . Fibromyalgia   . Anxiety     mild    Past Surgical History  Procedure Laterality Date  . Tonsillectomy    . Tubal ligation    . Colonoscopy    . Dilation and curettage of uterus       Social History Dominique Gillespie  reports that she quit smoking about 54 years ago. Her smoking use included Cigarettes. She smoked 0.00 packs per day for .5 years. She has never used smokeless tobacco. She reports that she does not drink alcohol or use illicit drugs.  family history includes Breast cancer in her mother; Heart disease in her father. There is no history of Colon cancer.  Allergies  Allergen Reactions  . Epinephrine Base Shortness Of Breath and Swelling  . Mepivacaine Hcl Hives  . Eggs Or Egg-Derived Products Swelling  . Iodine     IV contrast  . Statins   . Sulfonamide Derivatives        PHYSICAL EXAMINATION: Vital signs: BP 132/80  Pulse 76  Ht 5\' 4"  (1.626 m)  Wt 168 lb 3.2 oz (76.295 kg)  BMI 28.86 kg/m2 General: Well-developed, well-nourished, no acute distress HEENT: Sclerae are anicteric, conjunctiva pink. Oral mucosa intact Lungs: Clear Heart: Regular Abdomen: soft, nontender, nondistended, no obvious ascites, no peritoneal signs, normal bowel sounds. No organomegaly. Extremities: No edema Psychiatric: alert and oriented x3. Cooperative   ASSESSMENT:  #1. Recent problems with transient right upper quadrant pain. Most likely musculoskeletal. Resolved #2. Borborygmi. No worrisome features. #3. GERD. No active symptoms. On PPI. #4. History of adenomatous polyps   PLAN:  #1. Reassurance #2. Continue reflux precautions and PPI #3. Surveillance colonoscopy around December 2017. Interval followup as needed

## 2013-07-05 NOTE — Patient Instructions (Signed)
Please follow up with Dr. Perry as needed 

## 2013-11-16 ENCOUNTER — Telehealth: Payer: Self-pay | Admitting: Internal Medicine

## 2013-11-16 NOTE — Telephone Encounter (Signed)
Pt is on vacation and states she is having a lot of intestinal gas, describes that it feels like she has a "chipmunk running through them." Discussed with pt that she should limit gas causing foods and try gas-x or phazyme. Pt instructed to call us back if no improvement. Pt verbalized understanding.

## 2013-11-20 ENCOUNTER — Emergency Department (HOSPITAL_BASED_OUTPATIENT_CLINIC_OR_DEPARTMENT_OTHER): Payer: Medicare Other

## 2013-11-20 ENCOUNTER — Emergency Department (HOSPITAL_BASED_OUTPATIENT_CLINIC_OR_DEPARTMENT_OTHER)
Admission: EM | Admit: 2013-11-20 | Discharge: 2013-11-20 | Disposition: A | Discharge status: Home / Self Care | Payer: Medicare Other | Attending: Emergency Medicine | Admitting: Emergency Medicine

## 2013-11-20 ENCOUNTER — Encounter (HOSPITAL_BASED_OUTPATIENT_CLINIC_OR_DEPARTMENT_OTHER): Payer: Self-pay | Admitting: Emergency Medicine

## 2013-11-20 DIAGNOSIS — Z860100 Personal history of colon polyps, unspecified: Secondary | ICD-10-CM | POA: Insufficient documentation

## 2013-11-20 DIAGNOSIS — Z9889 Other specified postprocedural states: Secondary | ICD-10-CM | POA: Insufficient documentation

## 2013-11-20 DIAGNOSIS — E039 Hypothyroidism, unspecified: Secondary | ICD-10-CM | POA: Insufficient documentation

## 2013-11-20 DIAGNOSIS — K5904 Chronic idiopathic constipation: Secondary | ICD-10-CM

## 2013-11-20 DIAGNOSIS — Z7982 Long term (current) use of aspirin: Secondary | ICD-10-CM | POA: Insufficient documentation

## 2013-11-20 DIAGNOSIS — IMO0001 Reserved for inherently not codable concepts without codable children: Secondary | ICD-10-CM

## 2013-11-20 DIAGNOSIS — M479 Spondylosis, unspecified: Secondary | ICD-10-CM | POA: Insufficient documentation

## 2013-11-20 DIAGNOSIS — R000 Tachycardia, unspecified: Secondary | ICD-10-CM | POA: Insufficient documentation

## 2013-11-20 DIAGNOSIS — Z9851 Tubal ligation status: Secondary | ICD-10-CM | POA: Insufficient documentation

## 2013-11-20 DIAGNOSIS — F411 Generalized anxiety disorder: Secondary | ICD-10-CM | POA: Insufficient documentation

## 2013-11-20 DIAGNOSIS — Z87891 Personal history of nicotine dependence: Secondary | ICD-10-CM | POA: Insufficient documentation

## 2013-11-20 DIAGNOSIS — Z8669 Personal history of other diseases of the nervous system and sense organs: Secondary | ICD-10-CM | POA: Insufficient documentation

## 2013-11-20 DIAGNOSIS — N390 Urinary tract infection, site not specified: Secondary | ICD-10-CM

## 2013-11-20 DIAGNOSIS — K5900 Constipation, unspecified: Secondary | ICD-10-CM | POA: Insufficient documentation

## 2013-11-20 DIAGNOSIS — Z79899 Other long term (current) drug therapy: Secondary | ICD-10-CM | POA: Insufficient documentation

## 2013-11-20 DIAGNOSIS — IMO0002 Reserved for concepts with insufficient information to code with codable children: Secondary | ICD-10-CM | POA: Insufficient documentation

## 2013-11-20 DIAGNOSIS — E785 Hyperlipidemia, unspecified: Secondary | ICD-10-CM | POA: Insufficient documentation

## 2013-11-20 DIAGNOSIS — I10 Essential (primary) hypertension: Secondary | ICD-10-CM | POA: Insufficient documentation

## 2013-11-20 DIAGNOSIS — K219 Gastro-esophageal reflux disease without esophagitis: Secondary | ICD-10-CM | POA: Insufficient documentation

## 2013-11-20 HISTORY — DX: Benign paroxysmal vertigo, unspecified ear: H81.10

## 2013-11-20 LAB — COMPREHENSIVE METABOLIC PANEL
ALT: 15 U/L (ref 0–35)
AST: 18 U/L (ref 0–37)
Albumin: 3.7 g/dL (ref 3.5–5.2)
Alkaline Phosphatase: 85 U/L (ref 39–117)
BILIRUBIN TOTAL: 0.4 mg/dL (ref 0.3–1.2)
BUN: 7 mg/dL (ref 6–23)
CHLORIDE: 99 meq/L (ref 96–112)
CO2: 26 mEq/L (ref 19–32)
Calcium: 9.5 mg/dL (ref 8.4–10.5)
Creatinine, Ser: 0.7 mg/dL (ref 0.50–1.10)
GFR calc Af Amer: >90 mL/min (ref >90)
GFR calc non Af Amer: 85 mL/min — ABNORMAL LOW (ref >90)
Glucose, Bld: 112 mg/dL — ABNORMAL HIGH (ref 70–99)
POTASSIUM: 4.3 meq/L (ref 3.7–5.3)
SODIUM: 138 meq/L (ref 137–147)
TOTAL PROTEIN: 6.9 g/dL (ref 6.0–8.3)

## 2013-11-20 LAB — URINE MICROSCOPIC-ADD ON

## 2013-11-20 LAB — URINALYSIS, ROUTINE W REFLEX MICROSCOPIC
BILIRUBIN URINE: NEGATIVE
GLUCOSE, UA: NEGATIVE mg/dL
Hgb urine dipstick: NEGATIVE
KETONES UR: NEGATIVE mg/dL
Nitrite: NEGATIVE
Protein, ur: NEGATIVE mg/dL
SPECIFIC GRAVITY, URINE: 1.01 (ref 1.005–1.030)
Urobilinogen, UA: 0.2 mg/dL (ref 0.0–1.0)
pH: 6 (ref 5.0–8.0)

## 2013-11-20 LAB — CBC WITH DIFFERENTIAL/PLATELET
Basophils Absolute: 0.1 10*3/uL (ref 0.0–0.1)
Basophils Relative: 1 % (ref 0–1)
Eosinophils Absolute: 0.2 10*3/uL (ref 0.0–0.7)
Eosinophils Relative: 3 % (ref 0–5)
HCT: 37.4 % (ref 36.0–46.0)
Hemoglobin: 12.5 g/dL (ref 12.0–15.0)
Lymphocytes Relative: 19 % (ref 12–46)
Lymphs Abs: 1.2 10*3/uL (ref 0.7–4.0)
MCH: 28.8 pg (ref 26.0–34.0)
MCHC: 33.4 g/dL (ref 30.0–36.0)
MCV: 86.2 fL (ref 78.0–100.0)
MONOS PCT: 10 % (ref 3–12)
Monocytes Absolute: 0.6 10*3/uL (ref 0.1–1.0)
NEUTROS ABS: 4.2 10*3/uL (ref 1.7–7.7)
NEUTROS PCT: 68 % (ref 43–77)
PLATELETS: 260 10*3/uL (ref 150–400)
RBC: 4.34 MIL/uL (ref 3.87–5.11)
RDW: 13.6 % (ref 11.5–15.5)
WBC: 6.2 10*3/uL (ref 4.0–10.5)

## 2013-11-20 LAB — LIPASE, BLOOD: Lipase: 35 U/L (ref 11–59)

## 2013-11-20 MED ORDER — ALUM & MAG HYDROXIDE-SIMETH 200-200-20 MG/5ML PO SUSP
30.0000 mL | Freq: Once | ORAL | Status: DC
Start: 2013-11-20 — End: 2013-11-20

## 2013-11-20 MED ORDER — NITROFURANTOIN MONOHYD MACRO 100 MG PO CAPS: 100.0000 mg | ORAL_CAPSULE | Freq: Two times a day (BID) | ORAL | Status: DC

## 2013-11-20 MED ORDER — DICYCLOMINE HCL 10 MG/ML IM SOLN
20.0000 mg | Freq: Once | INTRAMUSCULAR | Status: AC
  Administered 2013-11-20: 20 mg via INTRAMUSCULAR
  Filled 2013-11-20: qty 2

## 2013-11-20 MED ORDER — ALUM & MAG HYDROXIDE-SIMETH 200-200-20 MG/5ML PO SUSP
30.0000 mL | Freq: Once | ORAL | Status: AC
  Administered 2013-11-20: 30 mL via ORAL
  Filled 2013-11-20: qty 30

## 2013-11-20 MED ORDER — NITROFURANTOIN MONOHYD MACRO 100 MG PO CAPS
100.0000 mg | ORAL_CAPSULE | Freq: Once | ORAL | Status: AC
  Administered 2013-11-20: 100 mg via ORAL
  Filled 2013-11-20: qty 1

## 2013-11-20 MED ORDER — DICYCLOMINE HCL 20 MG PO TABS: 20.0000 mg | ORAL_TABLET | Freq: Two times a day (BID) | ORAL | Status: DC

## 2013-11-20 MED ORDER — POLYETHYLENE GLYCOL 3350 17 GM/SCOOP PO POWD: 1.0000 | Freq: Once | ORAL | Status: DC

## 2013-11-20 MED ORDER — KETOROLAC TROMETHAMINE 60 MG/2ML IM SOLN
60.0000 mg | Freq: Once | INTRAMUSCULAR | Status: AC
  Administered 2013-11-20: 60 mg via INTRAMUSCULAR
  Filled 2013-11-20: qty 2

## 2013-11-20 NOTE — ED Provider Notes (Signed)
CSN: [id number]     Arrival date & time 11/20/13  0156 History   First MD Initiated Contact with Patient 11/20/13 0215     Chief Complaint  Patient presents with  . Abdominal Pain     (Consider location/radiation/quality/duration/timing/severity/associated sxs/prior Treatment) Patient is a 73 y.o. female presenting with abdominal pain. The history is provided by the patient.  Abdominal Pain Pain location:  Generalized (worse in the colonic distribution) Pain quality: bloating and cramping   Pain quality comment:  With lots of gas Pain radiates to:  Does not radiate Pain severity:  Moderate Onset quality:  Gradual Timing:  Constant Progression:  Unchanged Chronicity:  Recurrent Context: not medication withdrawal, not retching and not trauma   Relieved by:  Nothing Worsened by:  Nothing tried Associated symptoms: constipation   Associated symptoms: no chest pain, no diarrhea, no fever, no nausea and no vomiting   Risk factors: no alcohol abuse     Past Medical History  Diagnosis Date  . Tachycardia   . Hypertension   . Hyperlipidemia   . Hypothyroid   . Tendonitis of foot   . Palpitations   . Tubulovillous adenoma polyp of colon   . Interstitial cystitis   . Spondylarthrosis   . GERD (gastroesophageal reflux disease)   . Colon polyps     adenomatous  . IBS (irritable bowel syndrome)   . Hemorrhoids   . Hiatal hernia   . Fibromyalgia   . Anxiety     mild  . Vertigo, benign positional    Past Surgical History  Procedure Laterality Date  . Tonsillectomy    . Tubal ligation    . Colonoscopy    . Dilation and curettage of uterus     Family History  Problem Relation Age of Onset  . Breast cancer Mother   . Heart disease Father   . Colon cancer Neg Hx    History  Substance Use Topics  . Smoking status: Former Smoker -- .5 years    Types: Cigarettes    Quit date: 04/23/1959  . Smokeless tobacco: Never Used  . Alcohol Use: No   OB History   Grav Para Term  Preterm Abortions TAB SAB Ect Mult Living                 Review of Systems  Constitutional: Negative for fever.  Cardiovascular: Negative for chest pain, palpitations and leg swelling.  Gastrointestinal: Positive for abdominal pain and constipation. Negative for nausea, vomiting, diarrhea, blood in stool, anal bleeding and rectal pain.  All other systems reviewed and are negative.     Allergies  Carbocaine; Epinephrine base; Eggs or egg-derived products; Iodine; Statins; and Sulfonamide derivatives  Home Medications   Prior to Admission medications   Medication Sig Start Date End Date Taking? Authorizing Provider  ALPRAZolam Duanne Moron) 0.25 MG tablet Take 0.25 mg by mouth at bedtime as needed for anxiety.   Yes Historical Provider, MD  ezetimibe (ZETIA) 10 MG tablet Take 10 mg by mouth daily.   Yes Historical Provider, MD  AMBULATORY NON FORMULARY MEDICATION Medication Name: Freeze Dried Aloe Vera -Take as needed for I.C.    Historical Provider, MD  aspirin 81 MG tablet Take 81 mg by mouth daily.      Historical Provider, MD  Calcium Glycerophosphate (PRELIEF PO) Take 1 tablet by mouth daily.    Historical Provider, MD  CALCIUM-VITAMIN D PO Take 1 tablet by mouth daily.    Historical Provider, MD  cholecalciferol (VITAMIN  D) 1000 UNITS tablet Take 1,000 Units by mouth daily. Take 1000 units SL daily     Historical Provider, MD  co-enzyme Q-10 30 MG capsule Take 30 mg by mouth daily.      Historical Provider, MD  diazepam (VALIUM) 5 MG tablet Take 5 mg by mouth every 6 (six) hours as needed.      Historical Provider, MD  fish oil-omega-3 fatty acids 1000 MG capsule Take 1 g by mouth daily.      Historical Provider, MD  fluticasone (VERAMYST) 27.5 MCG/SPRAY nasal spray Place 2 sprays into the nose daily.    Historical Provider, MD  lansoprazole (PREVACID) 30 MG capsule Take 30 mg by mouth every other day.     Historical Provider, MD  levothyroxine (SYNTHROID, LEVOTHROID) 75 MCG tablet  Take 75 mcg by mouth daily before breakfast.    Historical Provider, MD  lisinopril (PRINIVIL,ZESTRIL) 10 MG tablet Take 10 mg by mouth daily.      Historical Provider, MD  metoprolol (LOPRESSOR) 50 MG tablet Take 50 mg by mouth 2 (two) times daily.    Historical Provider, MD  rosuvastatin (CRESTOR) 5 MG tablet Take 5 mg by mouth daily.    Historical Provider, MD  Soft Lens Products (SM SALINE SOLUTION) SOLN by Does not apply route.    Historical Provider, MD  valACYclovir (VALTREX) 1000 MG tablet Take 1,000 mg by mouth daily as needed.     Historical Provider, MD   BP 158/61  Pulse 73  Temp(Src) 99.2 F (37.3 C) (Oral)  Resp 20  Ht 5\' 4"  (1.626 m)  Wt 165 lb (74.844 kg)  BMI 28.31 kg/m2  SpO2 98% Physical Exam  Constitutional: She is oriented to person, place, and time. She appears well-developed and well-nourished. No distress.  HENT:  Head: Normocephalic and atraumatic.  Mouth/Throat: Oropharynx is clear and moist.  Eyes: Conjunctivae are normal. Pupils are equal, round, and reactive to light.  Neck: Normal range of motion. Neck supple.  Cardiovascular: Normal rate, regular rhythm and intact distal pulses.   Pulmonary/Chest: Effort normal and breath sounds normal. No respiratory distress. She has no wheezes. She has no rales.  Bowel sounds in thoracic cavity consistent with patient's known hiatal hernia  Abdominal: Soft. She exhibits no mass. Bowel sounds are increased. There is no tenderness. There is no rigidity, no rebound, no guarding, no tenderness at McBurney's point and negative Murphy's sign.  Musculoskeletal: Normal range of motion. She exhibits no edema and no tenderness.  Neurological: She is alert and oriented to person, place, and time.  Skin: Skin is warm and dry.  Psychiatric: She has a normal mood and affect.    ED Course  Procedures (including critical care time) Labs Review Labs Reviewed  CBC WITH DIFFERENTIAL  COMPREHENSIVE METABOLIC PANEL  LIPASE, BLOOD   URINALYSIS, ROUTINE W REFLEX MICROSCOPIC    Imaging Review No results found.   EKG Interpretation None      MDM   Final diagnoses:  None    Gas and spasm likely secondary to constipation seen on Xray.  Doubt GB as negative [place] in January and labs are all normal.  Follow up with your PMD and GI for ongoing care.  Will also treat for UTI.       Carlisle Beers, MD 11/20/13 [phone]

## 2013-11-20 NOTE — ED Notes (Signed)
Patient transported to X-ray 

## 2013-11-20 NOTE — ED Notes (Signed)
C/o generalized abd pain x 2 weeks  Some constipation

## 2013-11-20 NOTE — ED Notes (Signed)
Pt c/o upper abdominal bloating and pain x2wks; denies n/v

## 2013-11-22 ENCOUNTER — Encounter: Payer: Self-pay | Admitting: *Deleted

## 2013-11-22 ENCOUNTER — Telehealth: Payer: Self-pay | Admitting: Internal Medicine

## 2013-11-22 NOTE — Telephone Encounter (Signed)
Left message for pt to call back  °

## 2013-11-22 NOTE — Telephone Encounter (Signed)
Pt scheduled to see Alonza Bogus PA 11/29/13@ 1:30pm. Pt aware of appt.

## 2013-11-23 ENCOUNTER — Telehealth: Payer: Self-pay | Admitting: Internal Medicine

## 2013-11-23 NOTE — Telephone Encounter (Signed)
Pt states she thinks the dicyclomine is causing her to have a dry mouth, tingling in hands and feet, less sleep, and she states she had a period of confusion yesterday. Pt instructed to stop the medication. She states that she his having bowel movements and feeling some better. Pt will stop med.

## 2013-11-29 ENCOUNTER — Ambulatory Visit (INDEPENDENT_AMBULATORY_CARE_PROVIDER_SITE_OTHER): Payer: Medicare Other | Admitting: Gastroenterology

## 2013-11-29 ENCOUNTER — Encounter: Payer: Self-pay | Admitting: Gastroenterology

## 2013-11-29 VITALS — BP 122/80 | HR 80 | Ht 63.5 in | Wt 164.6 lb

## 2013-11-29 DIAGNOSIS — R6889 Other general symptoms and signs: Secondary | ICD-10-CM

## 2013-11-29 DIAGNOSIS — R109 Unspecified abdominal pain: Secondary | ICD-10-CM

## 2013-11-29 DIAGNOSIS — R140 Abdominal distension (gaseous): Secondary | ICD-10-CM

## 2013-11-29 DIAGNOSIS — K5900 Constipation, unspecified: Secondary | ICD-10-CM

## 2013-11-29 NOTE — Patient Instructions (Signed)
Continue taking your Miralax and Probiotics.  Please follow up as needed.

## 2013-12-07 DIAGNOSIS — R140 Abdominal distension (gaseous): Secondary | ICD-10-CM | POA: Insufficient documentation

## 2013-12-07 DIAGNOSIS — R109 Unspecified abdominal pain: Secondary | ICD-10-CM | POA: Insufficient documentation

## 2013-12-07 DIAGNOSIS — K5900 Constipation, unspecified: Secondary | ICD-10-CM | POA: Insufficient documentation

## 2013-12-07 NOTE — Progress Notes (Signed)
Agree with initial assessment and plans as outlined 

## 2013-12-07 NOTE — Progress Notes (Signed)
     [birth year] Dominique Gillespie [id number] [birth year]   History of Present Illness:  This is a 73 year old female who is known to Dr. Henrene Pastor.  She was seen earlier this year for complaints of abdominal pain that was thought to be musculoskeletal in origin.  She is followed by him for GERD, adenomatous colon polyps, and IBS.  She also has PMH of HTN, HLD, hypothyroidism., anxiety, fibromyalgia, and interstitial cystitis.  Her last EGD was in 2002 with mild reflux changes and small hiatal hernia.  She does take prevacid 30 mg daily for reflux, which works well.  Her last colonoscopy was 04/2011 at which time she had a polyp removed from the IC valve that was a tubular adenoma on pathology.  She also had small internal hemorrhoids.  She is here today for follow-up from an ER visit on 6/27.  She went to the ED for constipation and complaints of abdominal bloating and pain.  Abdominal x-ray was negative but they told her that she was constipated and that her pain was likely from spasm and gas related to the constipation.  CBC, CMP, and lipase were unremarkable.  They also diagnosed her with a UTI and treated her for that.  She has been taking Miralax daily, which is helping her to move her bowels and she has felt much better.  She also takes a daily probiotic, Culturelle.  In the ED she was given Bentyl 20 mg to take twice daily but says that made her feel worse.  No ongoing complaints currently.  Had a BM this morning.  She says that her hemorrhoids were bothering her as well but she knows that they will get better as her constipation gets better so she does not want anything for them currently.   Current Medications, Allergies, Past Medical History, Past Surgical History, Family History and Social History were reviewed in [employer] record.   Physical Exam: BP 122/80  Pulse 80  Ht 5' 3.5" (1.613 m)  Wt 164 lb 9.6 oz (74.662 kg)  BMI 28.70 kg/m2 General: Well developed  white female in no acute distress Head: Normocephalic and atraumatic Eyes:  Sclerae anicteric, conjunctiva pink  Ears: Normal auditory acuity Lungs: Clear throughout to auscultation Heart: Regular rate and rhythm Abdomen: Soft, non-distended.  Normal bowel sounds.  Non-tender. Musculoskeletal: Symmetrical with no gross deformities  Extremities: No edema  Neurological: Alert oriented x 4, grossly non-focal Psychological:  Alert and cooperative. Normal mood and affect  Assessment and Recommendations: -Constipation with abdominal gas/bloating, likely related to her diagnosis of IBS:  Resolved since moving her bowels.  Continue taking Miralax daily along with probiotic.  Follow-up prn.

## 2014-07-23 ENCOUNTER — Encounter (HOSPITAL_BASED_OUTPATIENT_CLINIC_OR_DEPARTMENT_OTHER): Payer: Self-pay | Admitting: *Deleted

## 2014-07-23 ENCOUNTER — Emergency Department (HOSPITAL_BASED_OUTPATIENT_CLINIC_OR_DEPARTMENT_OTHER)
Admission: EM | Admit: 2014-07-23 | Discharge: 2014-07-23 | Disposition: A | Discharge status: Home / Self Care | Payer: Medicare HMO | Attending: Emergency Medicine | Admitting: Emergency Medicine

## 2014-07-23 DIAGNOSIS — R61 Generalized hyperhidrosis: Secondary | ICD-10-CM | POA: Insufficient documentation

## 2014-07-23 DIAGNOSIS — T402X5A Adverse effect of other opioids, initial encounter: Secondary | ICD-10-CM | POA: Diagnosis not present

## 2014-07-23 DIAGNOSIS — Z86010 Personal history of colonic polyps: Secondary | ICD-10-CM | POA: Insufficient documentation

## 2014-07-23 DIAGNOSIS — Z7951 Long term (current) use of inhaled steroids: Secondary | ICD-10-CM | POA: Diagnosis not present

## 2014-07-23 DIAGNOSIS — Z7982 Long term (current) use of aspirin: Secondary | ICD-10-CM | POA: Insufficient documentation

## 2014-07-23 DIAGNOSIS — E039 Hypothyroidism, unspecified: Secondary | ICD-10-CM | POA: Diagnosis not present

## 2014-07-23 DIAGNOSIS — Z87891 Personal history of nicotine dependence: Secondary | ICD-10-CM | POA: Insufficient documentation

## 2014-07-23 DIAGNOSIS — E785 Hyperlipidemia, unspecified: Secondary | ICD-10-CM | POA: Insufficient documentation

## 2014-07-23 DIAGNOSIS — K219 Gastro-esophageal reflux disease without esophagitis: Secondary | ICD-10-CM | POA: Insufficient documentation

## 2014-07-23 DIAGNOSIS — R6884 Jaw pain: Secondary | ICD-10-CM | POA: Insufficient documentation

## 2014-07-23 DIAGNOSIS — Y998 Other external cause status: Secondary | ICD-10-CM | POA: Diagnosis not present

## 2014-07-23 DIAGNOSIS — X58XXXA Exposure to other specified factors, initial encounter: Secondary | ICD-10-CM | POA: Insufficient documentation

## 2014-07-23 DIAGNOSIS — Z8669 Personal history of other diseases of the nervous system and sense organs: Secondary | ICD-10-CM | POA: Insufficient documentation

## 2014-07-23 DIAGNOSIS — Z79899 Other long term (current) drug therapy: Secondary | ICD-10-CM | POA: Diagnosis not present

## 2014-07-23 DIAGNOSIS — Y9389 Activity, other specified: Secondary | ICD-10-CM | POA: Diagnosis not present

## 2014-07-23 DIAGNOSIS — Z792 Long term (current) use of antibiotics: Secondary | ICD-10-CM | POA: Insufficient documentation

## 2014-07-23 DIAGNOSIS — F419 Anxiety disorder, unspecified: Secondary | ICD-10-CM | POA: Insufficient documentation

## 2014-07-23 DIAGNOSIS — Y9289 Other specified places as the place of occurrence of the external cause: Secondary | ICD-10-CM | POA: Diagnosis not present

## 2014-07-23 DIAGNOSIS — M797 Fibromyalgia: Secondary | ICD-10-CM | POA: Insufficient documentation

## 2014-07-23 DIAGNOSIS — R002 Palpitations: Secondary | ICD-10-CM | POA: Insufficient documentation

## 2014-07-23 DIAGNOSIS — I10 Essential (primary) hypertension: Secondary | ICD-10-CM | POA: Insufficient documentation

## 2014-07-23 LAB — COMPREHENSIVE METABOLIC PANEL
ALT: 21 U/L (ref 0–35)
AST: 27 U/L (ref 0–37)
Albumin: 4.2 g/dL (ref 3.5–5.2)
Alkaline Phosphatase: 56 U/L (ref 39–117)
Anion gap: 6 (ref 5–15)
BUN: 12 mg/dL (ref 6–23)
CO2: 26 mmol/L (ref 19–32)
Calcium: 9.3 mg/dL (ref 8.4–10.5)
Chloride: 103 mmol/L (ref 96–112)
Creatinine, Ser: 0.73 mg/dL (ref 0.50–1.10)
GFR calc non Af Amer: 83 mL/min — ABNORMAL LOW (ref >90)
GLUCOSE: 97 mg/dL (ref 70–99)
Potassium: 4.8 mmol/L (ref 3.5–5.1)
SODIUM: 135 mmol/L (ref 135–145)
TOTAL PROTEIN: 7.4 g/dL (ref 6.0–8.3)
Total Bilirubin: 0.6 mg/dL (ref 0.3–1.2)

## 2014-07-23 LAB — CBC WITH DIFFERENTIAL/PLATELET
BASOS PCT: 1 % (ref 0–1)
Basophils Absolute: 0 10*3/uL (ref 0.0–0.1)
EOS ABS: 0.1 10*3/uL (ref 0.0–0.7)
EOS PCT: 2 % (ref 0–5)
HEMATOCRIT: 40.1 % (ref 36.0–46.0)
Hemoglobin: 13.1 g/dL (ref 12.0–15.0)
LYMPHS PCT: 17 % (ref 12–46)
Lymphs Abs: 1.2 10*3/uL (ref 0.7–4.0)
MCH: 28.3 pg (ref 26.0–34.0)
MCHC: 32.7 g/dL (ref 30.0–36.0)
MCV: 86.6 fL (ref 78.0–100.0)
Monocytes Absolute: 0.5 10*3/uL (ref 0.1–1.0)
Monocytes Relative: 7 % (ref 3–12)
Neutro Abs: 5.3 10*3/uL (ref 1.7–7.7)
Neutrophils Relative %: 73 % (ref 43–77)
Platelets: 256 10*3/uL (ref 150–400)
RBC: 4.63 MIL/uL (ref 3.87–5.11)
RDW: 13.6 % (ref 11.5–15.5)
WBC: 7.1 10*3/uL (ref 4.0–10.5)

## 2014-07-23 LAB — TROPONIN I: Troponin I: <0.03 ng/mL (ref <0.031)

## 2014-07-23 MED ORDER — ALPRAZOLAM 0.5 MG PO TABS
0.2500 mg | ORAL_TABLET | Freq: Once | ORAL | Status: AC
  Administered 2014-07-23: 0.25 mg via ORAL
  Filled 2014-07-23: qty 1

## 2014-07-23 MED ORDER — OXYCODONE-ACETAMINOPHEN 5-325 MG PO TABS
2.0000 | ORAL_TABLET | Freq: Once | ORAL | Status: AC
  Administered 2014-07-23: 2 via ORAL
  Filled 2014-07-23: qty 2

## 2014-07-23 MED ORDER — SODIUM CHLORIDE 0.9 % IV BOLUS (SEPSIS)
1000.0000 mL | Freq: Once | INTRAVENOUS | Status: AC
  Administered 2014-07-23: 1000 mL via INTRAVENOUS

## 2014-07-23 MED ORDER — MORPHINE SULFATE 4 MG/ML IJ SOLN
4.0000 mg | Freq: Once | INTRAMUSCULAR | Status: AC
  Administered 2014-07-23: 4 mg via INTRAVENOUS
  Filled 2014-07-23: qty 1

## 2014-07-23 MED ORDER — OXYCODONE-ACETAMINOPHEN 5-325 MG PO TABS: 2.0000 | ORAL_TABLET | ORAL | Status: DC | PRN

## 2014-07-23 NOTE — ED Provider Notes (Signed)
CSN: [id number]     Arrival date & time 07/23/14  1629 History  This chart was scribed for Wandra Arthurs, MD by Jeanell Sparrow, ED Scribe. This patient was seen in room MH10/MH10 and the patient's care was started at 6:39 PM.   Chief Complaint  Patient presents with  . Palpitations   The history is provided by the patient. No language interpreter was used.   HPI Comments: Dominique Gillespie is a 74 y.o. female who presents to the Emergency Department complaining of heart palpitations that started today. She reports that the palpitations with diaphoresis and started about an hour after taking a dose of hydrocodone today. She reports that she was started on hydrocodone and amoxicillin 2 days ago due to her constant moderate right jaw pain from a recent possible infection after dental work. She states that the pain is still present and today was the only episode of an abnormal reaction. She reports that she has a hx of palpitations and HTN. She denies any chest pain.   Past Medical History  Diagnosis Date  . Tachycardia   . Hypertension   . Hyperlipidemia   . Hypothyroid   . Tendonitis of foot   . Palpitations   . Tubulovillous adenoma polyp of colon   . Interstitial cystitis   . Spondylarthrosis   . GERD (gastroesophageal reflux disease)   . Colon polyps     adenomatous  . IBS (irritable bowel syndrome)   . Hemorrhoids   . Hiatal hernia   . Fibromyalgia   . Anxiety     mild  . Vertigo, benign positional    Past Surgical History  Procedure Laterality Date  . Tonsillectomy    . Tubal ligation    . Colonoscopy  2012  . Dilation and curettage of uterus     Family History  Problem Relation Age of Onset  . Breast cancer Mother   . Heart disease Father   . Colon cancer Neg Hx    History  Substance Use Topics  . Smoking status: Former Smoker -- .5 years    Types: Cigarettes    Quit date: 04/23/1959  . Smokeless tobacco: Never Used  . Alcohol Use: 2.4 oz/week    2 Glasses of  wine, 2 Shots of liquor per week     Comment: 5/days a week   OB History    No data available     Review of Systems  Constitutional: Positive for diaphoresis.  HENT: Positive for dental problem.   Cardiovascular: Positive for palpitations. Negative for chest pain.  All other systems reviewed and are negative.   Allergies  Carbocaine; Epinephrine base; Iodine; Statins; and Sulfonamide derivatives  Home Medications   Prior to Admission medications   Medication Sig Start Date End Date Taking? Authorizing Provider  ALPRAZolam Duanne Moron) 0.25 MG tablet Take 0.25 mg by mouth at bedtime as needed for anxiety.   Yes Historical Provider, MD  AMBULATORY NON FORMULARY MEDICATION Medication Name: Freeze Dried Aloe Vera -Take as needed for I.C.   Yes Historical Provider, MD  amoxicillin (AMOXIL) 875 MG tablet Take 875 mg by mouth 2 (two) times daily.   Yes Historical Provider, MD  aspirin 81 MG tablet Take 81 mg by mouth daily.     Yes Historical Provider, MD  Calcium Glycerophosphate (PRELIEF PO) Take 1 tablet by mouth daily.   Yes Historical Provider, MD  cholecalciferol (VITAMIN D) 1000 UNITS tablet Take 3,000 Units by mouth daily. Take 1000 units SL daily  Yes Historical Provider, MD  co-enzyme Q-10 30 MG capsule Take 30 mg by mouth daily.     Yes Historical Provider, MD  ezetimibe (ZETIA) 10 MG tablet Take 10 mg by mouth daily.   Yes Historical Provider, MD  fenofibrate 160 MG tablet Take 160 mg by mouth daily.   Yes Historical Provider, MD  fexofenadine (ALLEGRA) 180 MG tablet Take 180 mg by mouth daily.   Yes Historical Provider, MD  fish oil-omega-3 fatty acids 1000 MG capsule Take 1 g by mouth daily.     Yes Historical Provider, MD  fluvastatin (LESCOL) 20 MG capsule Take 20 mg by mouth at bedtime.   Yes Historical Provider, MD  HYDROcodone-acetaminophen (NORCO/VICODIN) 5-325 MG per tablet Take 1 tablet by mouth every 6 (six) hours as needed for moderate pain.   Yes Historical Provider, MD   lansoprazole (PREVACID) 30 MG capsule Take 30 mg by mouth daily.    Yes Historical Provider, MD  levothyroxine (SYNTHROID, LEVOTHROID) 75 MCG tablet Take 75 mcg by mouth daily before breakfast.   Yes Historical Provider, MD  lisinopril (PRINIVIL,ZESTRIL) 10 MG tablet Take 10 mg by mouth daily.     Yes Historical Provider, MD  metoprolol (LOPRESSOR) 50 MG tablet Take 50 mg by mouth 2 (two) times daily.   Yes Historical Provider, MD  sertraline (ZOLOFT) 25 MG tablet Take 25 mg by mouth daily.   Yes Historical Provider, MD  valACYclovir (VALTREX) 1000 MG tablet Take 1,000 mg by mouth daily as needed.    Yes Historical Provider, MD  CALCIUM-VITAMIN D PO Take 1 tablet by mouth daily.    Historical Provider, MD  dicyclomine (BENTYL) 20 MG tablet Take 1 tablet (20 mg total) by mouth 2 (two) times daily. 11/20/13   April K Palumbo-Rasch, MD  fluticasone (VERAMYST) 27.5 MCG/SPRAY nasal spray Place 2 sprays into the nose daily.    Historical Provider, MD  nitrofurantoin, macrocrystal-monohydrate, (MACROBID) 100 MG capsule Take 1 capsule (100 mg total) by mouth 2 (two) times daily. X 7 days 11/20/13   April K Palumbo-Rasch, MD  polyethylene glycol powder (MIRALAX) powder Take 255 g (1 Container total) by mouth once. 11/20/13   April K Palumbo-Rasch, MD  rosuvastatin (CRESTOR) 5 MG tablet Take 5 mg by mouth daily.    Historical Provider, MD   BP 190/73 mmHg  Pulse 88  Temp(Src) 97.8 F (36.6 C) (Oral)  Resp 18  Ht 5\' 4"  (1.626 m)  Wt 170 lb (77.111 kg)  BMI 29.17 kg/m2  SpO2 99% Physical Exam  Constitutional: She is oriented to person, place, and time. She appears well-developed and well-nourished. No distress.  HENT:  Head: Normocephalic and atraumatic.  No obvious periapical absecess.   Neck: Neck supple. No tracheal deviation present.  Cardiovascular: Normal rate, regular rhythm and normal heart sounds.  Exam reveals no gallop.   No murmur heard. Pulmonary/Chest: Effort normal and breath sounds  normal. No respiratory distress. She has no wheezes. She has no rales.  Abdominal: Soft. There is no tenderness.  Musculoskeletal: Normal range of motion. She exhibits no edema or tenderness.  No pedal edema. No calf TTP.   Lymphadenopathy:    She has no cervical adenopathy.  Neurological: She is alert and oriented to person, place, and time.  Skin: Skin is warm and dry.  Psychiatric: She has a normal mood and affect. Her behavior is normal.  Nursing note and vitals reviewed.   ED Course  Procedures (including critical care time) DIAGNOSTIC STUDIES: Oxygen  Saturation is 99% on RA, normal by my interpretation.    COORDINATION OF CARE: 6:43 PM- Pt advised of plan for treatment which includes labs and pt agrees.  Labs Review Labs Reviewed  COMPREHENSIVE METABOLIC PANEL - Abnormal; Notable for the following:    GFR calc non Af Amer 83 (*)    All other components within normal limits  CBC WITH DIFFERENTIAL/PLATELET  TROPONIN I  TROPONIN I    Imaging Review No results found.   EKG Interpretation   Date/Time:  Saturday July 23 2014 16:39:18 EST Ventricular Rate:  66 PR Interval:  160 QRS Duration: 80 QT Interval:  414 QTC Calculation: 434 R Axis:   61 Text Interpretation:  Normal sinus rhythm Normal ECG No significant change  since last tracing Confirmed by YAO  MD, DAVID ([id number]) on 07/23/2014  6:38:37 PM      MDM   Final diagnoses:  None   Dominique Gillespie is a 74 y.o. female here with diaphoresis s/p taking hydrocodone. No obvious signs of allergic reaction, no chest pain. Consider ACS. I doubt PE. Will get delta trop and give pain meds.   11:09 PM Pain improved with percocet. Delta trop neg. Stable d/c     I personally performed the services described in this documentation, which was scribed in my presence. The recorded information has been reviewed and is accurate.    Wandra Arthurs, MD 07/23/14 [phone]

## 2014-07-23 NOTE — Discharge Instructions (Signed)
Stop taking hydrocodone.   Take percocet instead. Do NOT drive with it.   Stay hydrated, see your dentist and regular doctor.   Return to ER if you have chest pain, trouble breathing, worse jaw pain.

## 2014-07-23 NOTE — ED Notes (Signed)
Patient states that she took half of a vicodin this afternoon at 3 15 pm. States that she became diaphoretic.  States that shortly thereafter she became cold and clammy.  Denies any rash or hives.

## 2014-07-23 NOTE — ED Notes (Addendum)
Pt reports episode of sweating, right jaw pain and palpitations approx 1 hour ago after taking a dose of hydrocodone- called triage RN with her insurance and was directed to come to ED for eval

## 2014-11-21 ENCOUNTER — Other Ambulatory Visit: Payer: Self-pay

## 2014-12-02 ENCOUNTER — Encounter (HOSPITAL_BASED_OUTPATIENT_CLINIC_OR_DEPARTMENT_OTHER): Payer: Self-pay | Admitting: Emergency Medicine

## 2014-12-02 ENCOUNTER — Emergency Department (HOSPITAL_BASED_OUTPATIENT_CLINIC_OR_DEPARTMENT_OTHER): Payer: Medicare HMO

## 2014-12-02 ENCOUNTER — Emergency Department (HOSPITAL_BASED_OUTPATIENT_CLINIC_OR_DEPARTMENT_OTHER)
Admission: EM | Admit: 2014-12-02 | Discharge: 2014-12-02 | Disposition: A | Discharge status: Home / Self Care | Payer: Medicare HMO | Attending: Emergency Medicine | Admitting: Emergency Medicine

## 2014-12-02 DIAGNOSIS — J3489 Other specified disorders of nose and nasal sinuses: Secondary | ICD-10-CM | POA: Insufficient documentation

## 2014-12-02 DIAGNOSIS — M797 Fibromyalgia: Secondary | ICD-10-CM | POA: Insufficient documentation

## 2014-12-02 DIAGNOSIS — I10 Essential (primary) hypertension: Secondary | ICD-10-CM | POA: Insufficient documentation

## 2014-12-02 DIAGNOSIS — Z87891 Personal history of nicotine dependence: Secondary | ICD-10-CM | POA: Insufficient documentation

## 2014-12-02 DIAGNOSIS — Z792 Long term (current) use of antibiotics: Secondary | ICD-10-CM | POA: Diagnosis not present

## 2014-12-02 DIAGNOSIS — Z86010 Personal history of colonic polyps: Secondary | ICD-10-CM | POA: Diagnosis not present

## 2014-12-02 DIAGNOSIS — Z79899 Other long term (current) drug therapy: Secondary | ICD-10-CM | POA: Diagnosis not present

## 2014-12-02 DIAGNOSIS — Z7951 Long term (current) use of inhaled steroids: Secondary | ICD-10-CM | POA: Diagnosis not present

## 2014-12-02 DIAGNOSIS — F419 Anxiety disorder, unspecified: Secondary | ICD-10-CM | POA: Insufficient documentation

## 2014-12-02 DIAGNOSIS — Z8669 Personal history of other diseases of the nervous system and sense organs: Secondary | ICD-10-CM | POA: Insufficient documentation

## 2014-12-02 DIAGNOSIS — Z7982 Long term (current) use of aspirin: Secondary | ICD-10-CM | POA: Insufficient documentation

## 2014-12-02 DIAGNOSIS — E039 Hypothyroidism, unspecified: Secondary | ICD-10-CM | POA: Insufficient documentation

## 2014-12-02 DIAGNOSIS — E785 Hyperlipidemia, unspecified: Secondary | ICD-10-CM | POA: Diagnosis not present

## 2014-12-02 DIAGNOSIS — R51 Headache: Secondary | ICD-10-CM | POA: Insufficient documentation

## 2014-12-02 DIAGNOSIS — R519 Headache, unspecified: Secondary | ICD-10-CM

## 2014-12-02 DIAGNOSIS — K219 Gastro-esophageal reflux disease without esophagitis: Secondary | ICD-10-CM | POA: Insufficient documentation

## 2014-12-02 DIAGNOSIS — R220 Localized swelling, mass and lump, head: Secondary | ICD-10-CM | POA: Diagnosis present

## 2014-12-02 LAB — CBC WITH DIFFERENTIAL/PLATELET
Basophils Absolute: 0.1 10*3/uL (ref 0.0–0.1)
Basophils Relative: 1 % (ref 0–1)
Eosinophils Absolute: 0.2 10*3/uL (ref 0.0–0.7)
Eosinophils Relative: 3 % (ref 0–5)
HCT: 38.3 % (ref 36.0–46.0)
Hemoglobin: 12.7 g/dL (ref 12.0–15.0)
Lymphocytes Relative: 22 % (ref 12–46)
Lymphs Abs: 1.2 10*3/uL (ref 0.7–4.0)
MCH: 28.7 pg (ref 26.0–34.0)
MCHC: 33.2 g/dL (ref 30.0–36.0)
MCV: 86.5 fL (ref 78.0–100.0)
Monocytes Absolute: 0.5 10*3/uL (ref 0.1–1.0)
Monocytes Relative: 10 % (ref 3–12)
NEUTROS PCT: 64 % (ref 43–77)
Neutro Abs: 3.5 10*3/uL (ref 1.7–7.7)
PLATELETS: 275 10*3/uL (ref 150–400)
RBC: 4.43 MIL/uL (ref 3.87–5.11)
RDW: 13.5 % (ref 11.5–15.5)
WBC: 5.4 10*3/uL (ref 4.0–10.5)

## 2014-12-02 LAB — BASIC METABOLIC PANEL
Anion gap: 4 — ABNORMAL LOW (ref 5–15)
BUN: 14 mg/dL (ref 6–20)
CO2: 27 mmol/L (ref 22–32)
CREATININE: 0.72 mg/dL (ref 0.44–1.00)
Calcium: 9.3 mg/dL (ref 8.9–10.3)
Chloride: 103 mmol/L (ref 101–111)
GFR calc Af Amer: >60 mL/min (ref >60)
GLUCOSE: 104 mg/dL — AB (ref 65–99)
POTASSIUM: 4.6 mmol/L (ref 3.5–5.1)
Sodium: 134 mmol/L — ABNORMAL LOW (ref 135–145)

## 2014-12-02 NOTE — ED Notes (Signed)
Patient transported to CT 

## 2014-12-02 NOTE — ED Provider Notes (Signed)
CSN: [id number]     Arrival date & time 12/02/14  7824 History   First MD Initiated Contact with Patient 12/02/14 1009     Chief Complaint  Patient presents with  . Facial Swelling  . Post-op Problem     (Consider location/radiation/quality/duration/timing/severity/associated sxs/prior Treatment) The history is provided by the patient.   74 year old female with difficulty with some right-sided facial pain some facial swelling pain that radiates into the soft tissue part of the neck. Patient's had dental work done in the past 2 months. Her provider feels is not related to her teeth. Sawyer nose and throat specialist yesterday without any specific findings. But did give the option to get CT of the area for further evaluation. Patient tried to get appointment with him today is felt things were worse today. Unable to get appointment since here to have CAT scans done. Patient denies any fevers. And difficulty swallowing.  Past Medical History  Diagnosis Date  . Tachycardia   . Hypertension   . Hyperlipidemia   . Hypothyroid   . Tendonitis of foot   . Palpitations   . Tubulovillous adenoma polyp of colon   . Interstitial cystitis   . Spondylarthrosis   . GERD (gastroesophageal reflux disease)   . Colon polyps     adenomatous  . IBS (irritable bowel syndrome)   . Hemorrhoids   . Hiatal hernia   . Fibromyalgia   . Anxiety     mild  . Vertigo, benign positional    Past Surgical History  Procedure Laterality Date  . Tonsillectomy    . Tubal ligation    . Colonoscopy  2012  . Dilation and curettage of uterus     Family History  Problem Relation Age of Onset  . Breast cancer Mother   . Heart disease Father   . Colon cancer Neg Hx    History  Substance Use Topics  . Smoking status: Former Smoker -- .5 years    Types: Cigarettes    Quit date: 04/23/1959  . Smokeless tobacco: Never Used  . Alcohol Use: 2.4 oz/week    2 Glasses of wine, 2 Shots of liquor per week     Comment:  5/days a week   OB History    No data available     Review of Systems  Constitutional: Negative for fever.  HENT: Positive for dental problem, facial swelling and sinus pressure. Negative for drooling, trouble swallowing and voice change.   Eyes: Negative for redness.  Respiratory: Negative for shortness of breath.   Cardiovascular: Negative for chest pain.  Gastrointestinal: Negative for abdominal pain.  Genitourinary: Negative for dysuria.  Musculoskeletal: Negative for back pain.  Neurological: Negative for headaches.  Hematological: Does not bruise/bleed easily.  Psychiatric/Behavioral: Negative for confusion.      Allergies  Carbocaine; Epinephrine base; Iodine; Statins; and Sulfonamide derivatives  Home Medications   Prior to Admission medications   Medication Sig Start Date End Date Taking? Authorizing Provider  ALPRAZolam Duanne Moron) 0.25 MG tablet Take 0.25 mg by mouth at bedtime as needed for anxiety.    Historical Provider, MD  AMBULATORY NON FORMULARY MEDICATION Medication Name: Freeze Dried Aloe Vera -Take as needed for I.C.    Historical Provider, MD  amoxicillin (AMOXIL) 875 MG tablet Take 875 mg by mouth 2 (two) times daily.    Historical Provider, MD  aspirin 81 MG tablet Take 81 mg by mouth daily.      Historical Provider, MD  Calcium Glycerophosphate (PRELIEF PO)  Take 1 tablet by mouth daily.    Historical Provider, MD  CALCIUM-VITAMIN D PO Take 1 tablet by mouth daily.    Historical Provider, MD  cholecalciferol (VITAMIN D) 1000 UNITS tablet Take 3,000 Units by mouth daily. Take 1000 units SL daily    Historical Provider, MD  co-enzyme Q-10 30 MG capsule Take 30 mg by mouth daily.      Historical Provider, MD  dicyclomine (BENTYL) 20 MG tablet Take 1 tablet (20 mg total) by mouth 2 (two) times daily. 11/20/13   April Palumbo, MD  ezetimibe (ZETIA) 10 MG tablet Take 10 mg by mouth daily.    Historical Provider, MD  fenofibrate 160 MG tablet Take 160 mg by mouth  daily.    Historical Provider, MD  fexofenadine (ALLEGRA) 180 MG tablet Take 180 mg by mouth daily.    Historical Provider, MD  fish oil-omega-3 fatty acids 1000 MG capsule Take 1 g by mouth daily.      Historical Provider, MD  fluticasone (VERAMYST) 27.5 MCG/SPRAY nasal spray Place 2 sprays into the nose daily.    Historical Provider, MD  fluvastatin (LESCOL) 20 MG capsule Take 20 mg by mouth at bedtime.    Historical Provider, MD  HYDROcodone-acetaminophen (NORCO/VICODIN) 5-325 MG per tablet Take 1 tablet by mouth every 6 (six) hours as needed for moderate pain.    Historical Provider, MD  lansoprazole (PREVACID) 30 MG capsule Take 30 mg by mouth daily.     Historical Provider, MD  levothyroxine (SYNTHROID, LEVOTHROID) 75 MCG tablet Take 75 mcg by mouth daily before breakfast.    Historical Provider, MD  lisinopril (PRINIVIL,ZESTRIL) 10 MG tablet Take 10 mg by mouth daily.      Historical Provider, MD  metoprolol (LOPRESSOR) 50 MG tablet Take 50 mg by mouth 2 (two) times daily.    Historical Provider, MD  nitrofurantoin, macrocrystal-monohydrate, (MACROBID) 100 MG capsule Take 1 capsule (100 mg total) by mouth 2 (two) times daily. X 7 days 11/20/13   April Palumbo, MD  oxyCODONE-acetaminophen (PERCOCET) 5-325 MG per tablet Take 2 tablets by mouth every 4 (four) hours as needed. 07/23/14   Wandra Arthurs, MD  polyethylene glycol powder (MIRALAX) powder Take 255 g (1 Container total) by mouth once. 11/20/13   April Palumbo, MD  rosuvastatin (CRESTOR) 5 MG tablet Take 5 mg by mouth daily.    Historical Provider, MD  sertraline (ZOLOFT) 25 MG tablet Take 25 mg by mouth daily.    Historical Provider, MD  valACYclovir (VALTREX) 1000 MG tablet Take 1,000 mg by mouth daily as needed.     Historical Provider, MD   BP 176/84 mmHg  Pulse 61  Temp(Src) 97.8 F (36.6 C) (Oral)  Resp 18  Ht 5\' 4"  (1.626 m)  Wt 178 lb (80.74 kg)  BMI 30.54 kg/m2  SpO2 99% Physical Exam  Constitutional: She is oriented to  person, place, and time. She appears well-developed and well-nourished. No distress.  HENT:  Head: Normocephalic and atraumatic.  Mouth/Throat: Oropharynx is clear and moist.  Eyes: Conjunctivae and EOM are normal. Pupils are equal, round, and reactive to light.  Neck: Normal range of motion.  Cardiovascular: Normal rate, regular rhythm and normal heart sounds.   No murmur heard. Pulmonary/Chest: Effort normal and breath sounds normal. No respiratory distress.  Abdominal: Soft. Bowel sounds are normal. There is no tenderness.  Musculoskeletal: Normal range of motion.  Neurological: She is alert and oriented to person, place, and time. No cranial nerve deficit.  Coordination normal.  Skin: Skin is warm. No erythema.  Nursing note and vitals reviewed.   ED Course  Procedures (including critical care time) Labs Review Labs Reviewed  BASIC METABOLIC PANEL - Abnormal; Notable for the following:    Sodium 134 (*)    Glucose, Bld 104 (*)    Anion gap 4 (*)    All other components within normal limits  CBC WITH DIFFERENTIAL/PLATELET    Imaging Review Ct Soft Tissue Neck Wo Contrast  12/02/2014   CLINICAL DATA:  Chronic facial and soft tissue neck pain. Root canal 10/19/2014. Postoperative swelling.  EXAM: CT MAXILLOFACIAL WITHOUT CONTRAST  CT NECK WITHOUT CONTRAST  TECHNIQUE: Multidetector CT imaging of the maxillofacial structures was performed. Multiplanar CT image reconstructions were also generated. A small metallic BB was placed on the right temple in order to reliably differentiate right from left.  Multidetector CT imaging of the neck was performed following the standard protocol without intravenous contrast.  COMPARISON:  None.  FINDINGS: The paranasal sinuses and mastoid air cells are clear. Orbits are unremarkable. The visualized portion of the brain is unremarkable.  There is extensive metallic dental streak artifact which severely limits evaluation of the oral cavity and overlying  soft tissues of the face. There is a small periapical lucency at the tip of the right maxillary first molar with a small defect in the maxillary buccal cortex. No fluid collection or significant inflammatory changes are identified in the overlying soft tissues.  No significant the subcutaneous fat infiltration is seen in the face (allowing for limitation by a metallic artifact) or neck aside from minimal stranding in the submental fat.  Submandibular and parotid glands are unremarkable. Thyroid gland is somewhat small diffusely without focal abnormality identified. No enlarged lymph nodes are identified in the neck. The visualized lung apices are clear. Multilevel disc degeneration is present in the cervical spine. There is intervertebral osseous fusion at C4-5.  IMPRESSION: Examination partially limited by extensive metallic dental streak artifact. At most minimal subcutaneous fat infiltration at the level of the chin. No fluid collection.   Electronically Signed   By: Logan Bores   On: 12/02/2014 11:34   Ct Maxillofacial Wo Cm  12/02/2014   CLINICAL DATA:  Chronic facial and soft tissue neck pain. Root canal 10/19/2014. Postoperative swelling.  EXAM: CT MAXILLOFACIAL WITHOUT CONTRAST  CT NECK WITHOUT CONTRAST  TECHNIQUE: Multidetector CT imaging of the maxillofacial structures was performed. Multiplanar CT image reconstructions were also generated. A small metallic BB was placed on the right temple in order to reliably differentiate right from left.  Multidetector CT imaging of the neck was performed following the standard protocol without intravenous contrast.  COMPARISON:  None.  FINDINGS: The paranasal sinuses and mastoid air cells are clear. Orbits are unremarkable. The visualized portion of the brain is unremarkable.  There is extensive metallic dental streak artifact which severely limits evaluation of the oral cavity and overlying soft tissues of the face. There is a small periapical lucency at the tip  of the right maxillary first molar with a small defect in the maxillary buccal cortex. No fluid collection or significant inflammatory changes are identified in the overlying soft tissues.  No significant the subcutaneous fat infiltration is seen in the face (allowing for limitation by a metallic artifact) or neck aside from minimal stranding in the submental fat.  Submandibular and parotid glands are unremarkable. Thyroid gland is somewhat small diffusely without focal abnormality identified. No enlarged lymph nodes are identified in  the neck. The visualized lung apices are clear. Multilevel disc degeneration is present in the cervical spine. There is intervertebral osseous fusion at C4-5.  IMPRESSION: Examination partially limited by extensive metallic dental streak artifact. At most minimal subcutaneous fat infiltration at the level of the chin. No fluid collection.   Electronically Signed   By: Logan Bores   On: 12/02/2014 11:34     EKG Interpretation None      MDM   Final diagnoses:  Face pain    Patient here for CT scans of the neck and face. Patient seen by her ear nose and throat doctor yesterday. And recommended the doing this if things got worse things got worse today and the knee and there is dark 1 was not available. Patient also has had some dental work done in the past 2 months. Symptoms are predominantly on the right side of the face and jaw. Seems to radiate down the neck.  Workup here without any sniffing and findings other than some dental abnormalities. Patient will follow up with ear nose and throat and with her Endodontisit.    Fredia Sorrow, MD 12/02/14 1300

## 2014-12-02 NOTE — Discharge Instructions (Signed)
Workup here today without significant findings. CT of face and soft tissue neck without any significant amounts. Recommend following up with ear nose and throat doctor for completion. They are seeing some evidence of perhaps some dental problems with recommend following up your Endodontist.

## 2014-12-02 NOTE — ED Notes (Signed)
Pt had root canal on 10/19/14.  Pt having issues post op with swelling, sinuses, and skin irritation.  Pt feels the swelling has worsens and states she needs a CT scan.  Pt has a list of information for review.  No airway impairment, no respiratory distress.

## 2014-12-28 ENCOUNTER — Encounter: Payer: Self-pay | Admitting: Internal Medicine

## 2014-12-28 ENCOUNTER — Ambulatory Visit (INDEPENDENT_AMBULATORY_CARE_PROVIDER_SITE_OTHER): Payer: Medicare HMO | Admitting: Internal Medicine

## 2014-12-28 VITALS — BP 106/70 | HR 72 | Ht 64.0 in | Wt 177.4 lb

## 2014-12-28 DIAGNOSIS — K589 Irritable bowel syndrome without diarrhea: Secondary | ICD-10-CM

## 2014-12-28 DIAGNOSIS — K219 Gastro-esophageal reflux disease without esophagitis: Secondary | ICD-10-CM | POA: Diagnosis not present

## 2014-12-28 DIAGNOSIS — R140 Abdominal distension (gaseous): Secondary | ICD-10-CM | POA: Diagnosis not present

## 2014-12-28 DIAGNOSIS — E785 Hyperlipidemia, unspecified: Secondary | ICD-10-CM | POA: Insufficient documentation

## 2014-12-28 DIAGNOSIS — R1011 Right upper quadrant pain: Secondary | ICD-10-CM

## 2014-12-28 DIAGNOSIS — I10 Essential (primary) hypertension: Secondary | ICD-10-CM | POA: Insufficient documentation

## 2014-12-28 NOTE — Patient Instructions (Signed)
Continue to take Align.  Metamucil daily  Follow up as needed

## 2014-12-28 NOTE — Progress Notes (Signed)
HISTORY OF PRESENT ILLNESS:  Dominique Gillespie is a 74 y.o. female with multiple medical problems as listed below. She is followed in this office for GERD, adenomatous colon polyps, and IBS. I last saw the patient February 2015 for right upper quadrant pain which is felt to be musculoskeletal. As well as borborygmi. She presents today with chief complaint of bloating and change in bowel habits. She attributes this to multiple antibiotics for dental work and increased stress. She reports increased flatus. Continues with intermittent right upper quadrant pain which is related to movements. Currently taking PPI every other day with good control of GERD symptoms. Her bowels tend to be constipated. Metamucil helps though she uses this infrequently. She is up-to-date on surveillance colonoscopy. She has had recent evaluation elsewhere for these complaints. She brings records for review. Outside laboratories reveal normal liver function tests. Negative testing for celiac disease. Abdominal ultrasound was normal. CBC was normal. Last EGD 2002 revealed reflux esophagitis and hiatal hernia.  REVIEW OF SYSTEMS:  All non-GI ROS negative except for sinus allergy, anxiety, back pain, depression, muscle cramps, hearing problems, sleeping problems, ankle edema  Past Medical History  Diagnosis Date  . Tachycardia   . Hypertension   . Hyperlipidemia   . Hypothyroid   . Tendonitis of foot   . Palpitations   . Tubulovillous adenoma polyp of colon   . Interstitial cystitis   . Spondylarthrosis   . GERD (gastroesophageal reflux disease)   . Colon polyps     adenomatous  . IBS (irritable bowel syndrome)   . Hemorrhoids   . Hiatal hernia   . Fibromyalgia   . Anxiety     mild  . Vertigo, benign positional   . Osteoporosis   . Scoliosis     Past Surgical History  Procedure Laterality Date  . Tonsillectomy    . Tubal ligation    . Colonoscopy  2012  . Dilation and curettage of uterus      Social  History Dominique Gillespie  reports that she quit smoking about 55 years ago. Her smoking use included Cigarettes. She quit after .5 years of use. She has never used smokeless tobacco. She reports that she drinks about 2.4 oz of alcohol per week. She reports that she does not use illicit drugs.  family history includes Breast cancer in her mother; Heart disease in her father. There is no history of Colon cancer.  Allergies  Allergen Reactions  . Carbocaine [Mepivacaine Hcl] Hives  . Epinephrine Base Shortness Of Breath and Swelling  . Iodine     IV contrast  . Statins   . Sulfonamide Derivatives        PHYSICAL EXAMINATION: Vital signs: BP 106/70 mmHg  Pulse 72  Ht 5\' 4"  (1.626 m)  Wt 177 lb 6 oz (80.457 kg)  BMI 30.43 kg/m2 General: Well-developed, well-nourished, no acute distress HEENT: Sclerae are anicteric, conjunctiva pink. Oral mucosa intact Lungs: Clear Heart: Regular Abdomen: soft, obese, nontender, nondistended, no obvious ascites, no peritoneal signs, normal bowel sounds. No organomegaly. Extremities: No clubbing cyanosis or edema Psychiatric: alert and oriented x3. Cooperative   ASSESSMENT:  #1. Irritable bowel syndrome. Tendency toward constipation with bloating and gas #2. GERD. Managed with every other day PPI #3. Chronic intermittent right upper quadrant musculoskeletal pain #4. History of adenomatous colon polyps. Due for surveillance December 2017  PLAN:  #1. Recommend Metamucil 1-2 tablespoons daily #2. Recommend align probiotic once daily for 4 weeks #3. Reassurance regarding right upper  quadrant pain #4. Surveillance colonoscopy around December 2017 #5. Interval GI follow-up as needed

## 2014-12-29 ENCOUNTER — Telehealth: Payer: Self-pay | Admitting: Internal Medicine

## 2014-12-29 DIAGNOSIS — I2510 Atherosclerotic heart disease of native coronary artery without angina pectoris: Secondary | ICD-10-CM | POA: Insufficient documentation

## 2014-12-30 MED ORDER — PSYLLIUM 28.3 % PO POWD: ORAL | Status: DC

## 2014-12-30 MED ORDER — ALIGN PO CAPS: 1.0000 | ORAL_CAPSULE | Freq: Every day | ORAL | Status: AC

## 2014-12-30 NOTE — Telephone Encounter (Signed)
Sent hard copy of Metamucil and Align to patient per her request.

## 2015-01-10 ENCOUNTER — Other Ambulatory Visit: Payer: Self-pay | Admitting: Gynecology

## 2015-01-11 LAB — CYTOLOGY - PAP

## 2015-04-03 ENCOUNTER — Ambulatory Visit: Payer: Medicare HMO | Admitting: Internal Medicine

## 2015-04-04 ENCOUNTER — Encounter: Payer: Self-pay | Admitting: Internal Medicine

## 2015-05-19 ENCOUNTER — Encounter (HOSPITAL_BASED_OUTPATIENT_CLINIC_OR_DEPARTMENT_OTHER): Payer: Self-pay | Admitting: *Deleted

## 2015-05-19 ENCOUNTER — Emergency Department (HOSPITAL_BASED_OUTPATIENT_CLINIC_OR_DEPARTMENT_OTHER)
Admission: EM | Admit: 2015-05-19 | Discharge: 2015-05-20 | Disposition: A | Discharge status: Home / Self Care | Payer: Medicare HMO | Attending: Emergency Medicine | Admitting: Emergency Medicine

## 2015-05-19 DIAGNOSIS — I10 Essential (primary) hypertension: Secondary | ICD-10-CM | POA: Diagnosis not present

## 2015-05-19 DIAGNOSIS — E039 Hypothyroidism, unspecified: Secondary | ICD-10-CM | POA: Diagnosis not present

## 2015-05-19 DIAGNOSIS — Z87448 Personal history of other diseases of urinary system: Secondary | ICD-10-CM | POA: Insufficient documentation

## 2015-05-19 DIAGNOSIS — K219 Gastro-esophageal reflux disease without esophagitis: Secondary | ICD-10-CM | POA: Diagnosis not present

## 2015-05-19 DIAGNOSIS — Z7951 Long term (current) use of inhaled steroids: Secondary | ICD-10-CM | POA: Insufficient documentation

## 2015-05-19 DIAGNOSIS — Z792 Long term (current) use of antibiotics: Secondary | ICD-10-CM | POA: Diagnosis not present

## 2015-05-19 DIAGNOSIS — Z7982 Long term (current) use of aspirin: Secondary | ICD-10-CM | POA: Diagnosis not present

## 2015-05-19 DIAGNOSIS — Z79899 Other long term (current) drug therapy: Secondary | ICD-10-CM | POA: Insufficient documentation

## 2015-05-19 DIAGNOSIS — Z87891 Personal history of nicotine dependence: Secondary | ICD-10-CM | POA: Insufficient documentation

## 2015-05-19 DIAGNOSIS — E785 Hyperlipidemia, unspecified: Secondary | ICD-10-CM | POA: Diagnosis not present

## 2015-05-19 DIAGNOSIS — F419 Anxiety disorder, unspecified: Secondary | ICD-10-CM | POA: Insufficient documentation

## 2015-05-19 DIAGNOSIS — Z86010 Personal history of colonic polyps: Secondary | ICD-10-CM | POA: Insufficient documentation

## 2015-05-19 DIAGNOSIS — R519 Headache, unspecified: Secondary | ICD-10-CM

## 2015-05-19 DIAGNOSIS — R51 Headache: Secondary | ICD-10-CM | POA: Insufficient documentation

## 2015-05-19 DIAGNOSIS — M810 Age-related osteoporosis without current pathological fracture: Secondary | ICD-10-CM | POA: Diagnosis not present

## 2015-05-19 DIAGNOSIS — K589 Irritable bowel syndrome without diarrhea: Secondary | ICD-10-CM | POA: Diagnosis not present

## 2015-05-19 DIAGNOSIS — Z8669 Personal history of other diseases of the nervous system and sense organs: Secondary | ICD-10-CM | POA: Insufficient documentation

## 2015-05-19 NOTE — ED Provider Notes (Signed)
CSN: [id number]     Arrival date & time 05/19/15  2141 History  By signing my name below, I, Dominique Gillespie, attest that this documentation has been prepared under the direction and in the presence of Merryl Hacker, MD. Electronically Signed: Soijett Gillespie, ED Scribe. 05/19/2015. 1:50 AM.   Chief Complaint  Patient presents with  . Hypertension      The history is provided by the patient. No language interpreter was used.    DYMPLE GATICA is a 74 y.o. female with a medical hx of HTN who presents to the Emergency Department complaining of HTN onset 7:50 PM tonight. She reports that she took her BP at the onset of her symptoms and her blood pressure was elevated. She notes that she took all of her HTN medications as well as her alprazolam. She states that she re-took her blood pressure and it decreased gradually. She reports that she felt a "spike" and she thinks that her blood pressure elevated. She notes that she was drinking alcohol prior to the onset of her symptoms and she regularly drinks 1 alcoholic beverages. Pt notes that she now feels better since being in the ED. Pt is having associated symptoms of 3-4/10 intermittent HA and sinus pressure. Reports 2 week history of sinus pressure. No fevers. She denies this being the worst HA of her life and that she doesn't currently have a HA. She notes that she has not tried any medications for the relief of her HA. She denies weakness, difficulty speaking, n/v, urinary symptoms, dysuria, and any other symptoms. Pt was given an injection of prolia for her osteoporosis on 03/27/2015 and has had body aches and edema to her bilateral lower extremities since.    Past Medical History  Diagnosis Date  . Tachycardia   . Hypertension   . Hyperlipidemia   . Hypothyroid   . Tendonitis of foot   . Palpitations   . Tubulovillous adenoma polyp of colon   . Interstitial cystitis   . Spondylarthrosis   . GERD (gastroesophageal reflux disease)   .  Colon polyps     adenomatous  . IBS (irritable bowel syndrome)   . Hemorrhoids   . Hiatal hernia   . Fibromyalgia   . Anxiety     mild  . Vertigo, benign positional   . Osteoporosis   . Scoliosis    Past Surgical History  Procedure Laterality Date  . Tonsillectomy    . Tubal ligation    . Colonoscopy  2012  . Dilation and curettage of uterus     Family History  Problem Relation Age of Onset  . Breast cancer Mother   . Heart disease Father   . Colon cancer Neg Hx    Social History  Substance Use Topics  . Smoking status: Former Smoker -- .5 years    Types: Cigarettes    Quit date: 04/23/1959  . Smokeless tobacco: Never Used  . Alcohol Use: 2.4 oz/week    2 Glasses of wine, 2 Shots of liquor per week     Comment: 5/days a week   OB History    No data available     Review of Systems  Constitutional: Negative for fever.  HENT: Positive for sinus pressure.   Respiratory: Negative for chest tightness.   Gastrointestinal: Negative for nausea, vomiting and abdominal pain.  Musculoskeletal: Negative for neck pain.  Neurological: Positive for headaches. Negative for weakness and numbness.  All other systems reviewed and are negative.  Allergies  Carbocaine; Epinephrine base; Iodine; Statins; and Sulfonamide derivatives  Home Medications   Prior to Admission medications   Medication Sig Start Date End Date Taking? Authorizing Provider  Alirocumab (PRALUENT) 150 MG/ML SOPN Inject into the skin.   Yes Historical Provider, MD  denosumab (PROLIA) 60 MG/ML SOLN injection Inject 150 mg into the skin every 6 (six) months. Administer in upper arm, thigh, or abdomen   Yes Historical Provider, MD  ALPRAZolam (XANAX) 0.25 MG tablet Take 0.25 mg by mouth at bedtime as needed for anxiety.    Historical Provider, MD  AMBULATORY NON FORMULARY MEDICATION Medication Name: Freeze Dried Aloe Vera -Take as needed for I.C.    Historical Provider, MD  amoxicillin (AMOXIL) 875 MG  tablet Take 875 mg by mouth 2 (two) times daily.    Historical Provider, MD  aspirin 81 MG tablet Take 81 mg by mouth daily.      Historical Provider, MD  bifidobacterium infantis (ALIGN) capsule Take 1 capsule by mouth daily. 12/30/14   Irene Shipper, MD  Calcium Glycerophosphate (PRELIEF PO) Take 1 tablet by mouth daily.    Historical Provider, MD  CALCIUM-VITAMIN D PO Take 1 tablet by mouth daily.    Historical Provider, MD  cholecalciferol (VITAMIN D) 1000 UNITS tablet Take 3,000 Units by mouth daily. Take 1000 units SL daily    Historical Provider, MD  co-enzyme Q-10 30 MG capsule Take 30 mg by mouth daily.      Historical Provider, MD  dicyclomine (BENTYL) 20 MG tablet Take 1 tablet (20 mg total) by mouth 2 (two) times daily. 11/20/13   April Palumbo, MD  ezetimibe (ZETIA) 10 MG tablet Take 10 mg by mouth daily.    Historical Provider, MD  fenofibrate 160 MG tablet Take 160 mg by mouth daily.    Historical Provider, MD  fexofenadine (ALLEGRA) 180 MG tablet Take 180 mg by mouth daily.    Historical Provider, MD  fish oil-omega-3 fatty acids 1000 MG capsule Take 1 g by mouth daily.      Historical Provider, MD  fluticasone (VERAMYST) 27.5 MCG/SPRAY nasal spray Place 2 sprays into the nose daily.    Historical Provider, MD  fluvastatin (LESCOL) 20 MG capsule Take 20 mg by mouth at bedtime.    Historical Provider, MD  HYDROcodone-acetaminophen (NORCO/VICODIN) 5-325 MG per tablet Take 1 tablet by mouth every 6 (six) hours as needed for moderate pain.    Historical Provider, MD  lansoprazole (PREVACID) 30 MG capsule Take 30 mg by mouth daily.     Historical Provider, MD  levothyroxine (SYNTHROID, LEVOTHROID) 75 MCG tablet Take 75 mcg by mouth daily before breakfast.    Historical Provider, MD  lisinopril (PRINIVIL,ZESTRIL) 10 MG tablet Take 10 mg by mouth daily.      Historical Provider, MD  metoprolol (LOPRESSOR) 50 MG tablet Take 50 mg by mouth 2 (two) times daily.    Historical Provider, MD   nitrofurantoin, macrocrystal-monohydrate, (MACROBID) 100 MG capsule Take 1 capsule (100 mg total) by mouth 2 (two) times daily. X 7 days 11/20/13   April Palumbo, MD  oxyCODONE-acetaminophen (PERCOCET) 5-325 MG per tablet Take 2 tablets by mouth every 4 (four) hours as needed. 07/23/14   Wandra Arthurs, MD  polyethylene glycol powder (MIRALAX) powder Take 255 g (1 Container total) by mouth once. 11/20/13   April Palumbo, MD  Psyllium (METAMUCIL) 28.3 % POWD Take one tablespoon mixed in water or juice daily 12/30/14   Irene Shipper, MD  rosuvastatin (CRESTOR) 5 MG tablet Take 5 mg by mouth daily.    Historical Provider, MD  sertraline (ZOLOFT) 25 MG tablet Take 25 mg by mouth daily.    Historical Provider, MD  temazepam (RESTORIL) 15 MG capsule Take 15 mg by mouth 2 (two) times daily.    Historical Provider, MD  valACYclovir (VALTREX) 1000 MG tablet Take 1,000 mg by mouth daily as needed.     Historical Provider, MD   BP 167/80 mmHg  Pulse 60  Temp(Src) 97.7 F (36.5 C) (Oral)  Resp 16  Ht 5\' 4"  (1.626 m)  Wt 180 lb (81.647 kg)  BMI 30.88 kg/m2  SpO2 99% Physical Exam  Constitutional: She is oriented to person, place, and time. She appears well-developed and well-nourished. No distress.  HENT:  Head: Normocephalic and atraumatic.  Eyes: Pupils are equal, round, and reactive to light.  Neck: Normal range of motion. Neck supple.  Cardiovascular: Normal rate, regular rhythm and normal heart sounds.   No murmur heard. Pulmonary/Chest: Effort normal and breath sounds normal. No respiratory distress. She has no wheezes.  Abdominal: Soft. Bowel sounds are normal. There is no tenderness. There is no rebound.  Musculoskeletal: She exhibits edema.  Compression hose in place, trace edema  Neurological: She is alert and oriented to person, place, and time. She has normal reflexes.  Cranial nerves II through XII intact, 5 out of 5 strength in all 4 extremities  Skin: Skin is warm and dry.  Psychiatric:  She has a normal mood and affect.  Nursing note and vitals reviewed.   ED Course  Procedures (including critical care time) DIAGNOSTIC STUDIES: Oxygen Saturation is 100% on RA, nl by my interpretation.    COORDINATION OF CARE: 11:29 PM Discussed treatment plan with pt at bedside which includes labs and EKG and pt agreed to plan.    Labs Review Labs Reviewed  BASIC METABOLIC PANEL - Abnormal; Notable for the following:    Glucose, Bld 116 (*)    All other components within normal limits  CBC WITH DIFFERENTIAL/PLATELET  TROPONIN I    Imaging Review No results found. I have personally reviewed and evaluated these images and lab results as part of my medical decision-making.   EKG Interpretation   Date/Time:  Saturday May 20 2015 00:08:16 EST Ventricular Rate:  58 PR Interval:  166 QRS Duration: 78 QT Interval:  440 QTC Calculation: 431 R Axis:   63 Text Interpretation:  Sinus bradycardia Otherwise normal ECG Confirmed by  HORTON  MD, COURTNEY ([id number]) on 05/20/2015 1:03:51 AM      MDM   Final diagnoses:  Essential hypertension  Sinus headache    Patient presents with concerns for hypertension. Reports that she had multiple blood pressure readings at home that were elevated. Reports compliance with medications. Reports a two-week history of intermittent dull headache that she associates with her sinuses. She was striking alcohol tonight but reports daily drinking of 1 alcoholic beverage. She currently is a systematic. Initial blood pressure 196/101. Currently 163/85. She is nonfocal and otherwise asymptomatic. EKG is reassuring and basic labwork is also reassuring. She has remained asymptomatic in the ER. Discussed with the patient that I did not feel she have any signs or symptoms of hypertensive urgency or emergency. She was reassured. Follow-up with primary physician recommended.  After history, exam, and medical workup I feel the patient has been appropriately  medically screened and is safe for discharge home. Pertinent diagnoses were discussed with the patient. Patient was  given return precautions.  I personally performed the services described in this documentation, which was scribed in my presence. The recorded information has been reviewed and is accurate.    Merryl Hacker, MD 05/20/15 [id number]

## 2015-05-19 NOTE — ED Notes (Signed)
Pt c/o increased BP and h/a x 1 day  

## 2015-05-20 LAB — CBC WITH DIFFERENTIAL/PLATELET
Basophils Absolute: 0.1 10*3/uL (ref 0.0–0.1)
Basophils Relative: 1 %
EOS ABS: 0.3 10*3/uL (ref 0.0–0.7)
EOS PCT: 4 %
HCT: 38.1 % (ref 36.0–46.0)
Hemoglobin: 12.1 g/dL (ref 12.0–15.0)
Lymphocytes Relative: 18 %
Lymphs Abs: 1.4 10*3/uL (ref 0.7–4.0)
MCH: 27.4 pg (ref 26.0–34.0)
MCHC: 31.8 g/dL (ref 30.0–36.0)
MCV: 86.4 fL (ref 78.0–100.0)
Monocytes Absolute: 0.7 10*3/uL (ref 0.1–1.0)
Monocytes Relative: 8 %
NEUTROS PCT: 69 %
Neutro Abs: 5.4 10*3/uL (ref 1.7–7.7)
PLATELETS: 262 10*3/uL (ref 150–400)
RBC: 4.41 MIL/uL (ref 3.87–5.11)
RDW: 14.2 % (ref 11.5–15.5)
WBC: 7.9 10*3/uL (ref 4.0–10.5)

## 2015-05-20 LAB — TROPONIN I

## 2015-05-20 LAB — BASIC METABOLIC PANEL
ANION GAP: 6 (ref 5–15)
BUN: 9 mg/dL (ref 6–20)
CO2: 28 mmol/L (ref 22–32)
Calcium: 9.5 mg/dL (ref 8.9–10.3)
Chloride: 107 mmol/L (ref 101–111)
Creatinine, Ser: 0.66 mg/dL (ref 0.44–1.00)
GFR calc Af Amer: >60 mL/min (ref >60)
GFR calc non Af Amer: >60 mL/min (ref >60)
Glucose, Bld: 116 mg/dL — ABNORMAL HIGH (ref 65–99)
POTASSIUM: 4.6 mmol/L (ref 3.5–5.1)
SODIUM: 141 mmol/L (ref 135–145)

## 2015-05-20 NOTE — Discharge Instructions (Signed)
Hypertension Hypertension, commonly called high blood pressure, is when the force of blood pumping through your arteries is too strong. Your arteries are the blood vessels that carry blood from your heart throughout your body. A blood pressure reading consists of a higher number over a lower number, such as 110/72. The higher number (systolic) is the pressure inside your arteries when your heart pumps. The lower number (diastolic) is the pressure inside your arteries when your heart relaxes. Ideally you want your blood pressure below 120/80. Hypertension forces your heart to work harder to pump blood. Your arteries may become narrow or stiff. Having untreated or uncontrolled hypertension can cause heart attack, stroke, kidney disease, and other problems. RISK FACTORS Some risk factors for high blood pressure are controllable. Others are not.  Risk factors you cannot control include:   Race. You may be at higher risk if you are African American.  Age. Risk increases with age.  Gender. Men are at higher risk than women before age 45 years. After age 65, women are at higher risk than men. Risk factors you can control include:  Not getting enough exercise or physical activity.  Being overweight.  Getting too much fat, sugar, calories, or salt in your diet.  Drinking too much alcohol. SIGNS AND SYMPTOMS Hypertension does not usually cause signs or symptoms. Extremely high blood pressure (hypertensive crisis) may cause headache, anxiety, shortness of breath, and nosebleed. DIAGNOSIS To check if you have hypertension, your health care provider will measure your blood pressure while you are seated, with your arm held at the level of your heart. It should be measured at least twice using the same arm. Certain conditions can cause a difference in blood pressure between your right and left arms. A blood pressure reading that is higher than normal on one occasion does not mean that you need treatment. If  it is not clear whether you have high blood pressure, you may be asked to return on a different day to have your blood pressure checked again. Or, you may be asked to monitor your blood pressure at home for 1 or more weeks. TREATMENT Treating high blood pressure includes making lifestyle changes and possibly taking medicine. Living a healthy lifestyle can help lower high blood pressure. You may need to change some of your habits. Lifestyle changes may include:  Following the DASH diet. This diet is high in fruits, vegetables, and whole grains. It is low in salt, red meat, and added sugars.  Keep your sodium intake below 2,300 mg per day.  Getting at least 30-45 minutes of aerobic exercise at least 4 times per week.  Losing weight if necessary.  Not smoking.  Limiting alcoholic beverages.  Learning ways to reduce stress. Your health care provider may prescribe medicine if lifestyle changes are not enough to get your blood pressure under control, and if one of the following is true:  You are 18-59 years of age and your systolic blood pressure is above 140.  You are 60 years of age or older, and your systolic blood pressure is above 150.  Your diastolic blood pressure is above 90.  You have diabetes, and your systolic blood pressure is over 140 or your diastolic blood pressure is over 90.  You have kidney disease and your blood pressure is above 140/90.  You have heart disease and your blood pressure is above 140/90. Your personal target blood pressure may vary depending on your medical conditions, your age, and other factors. HOME CARE INSTRUCTIONS    Have your blood pressure rechecked as directed by your health care provider.   Take medicines only as directed by your health care provider. Follow the directions carefully. Blood pressure medicines must be taken as prescribed. The medicine does not work as well when you skip doses. Skipping doses also puts you at risk for  problems.  Do not smoke.   Monitor your blood pressure at home as directed by your health care provider. SEEK MEDICAL CARE IF:   You think you are having a reaction to medicines taken.  You have recurrent headaches or feel dizzy.  You have swelling in your ankles.  You have trouble with your vision. SEEK IMMEDIATE MEDICAL CARE IF:  You develop a severe headache or confusion.  You have unusual weakness, numbness, or feel faint.  You have severe chest or abdominal pain.  You vomit repeatedly.  You have trouble breathing. MAKE SURE YOU:   Understand these instructions.  Will watch your condition.  Will get help right away if you are not doing well or get worse.   This information is not intended to replace advice given to you by your health care provider. Make sure you discuss any questions you have with your health care provider.   Document Released: 05/13/2005 Document Revised: 09/27/2014 Document Reviewed: 03/05/2013 Elsevier Interactive Patient Education 2016 [employer]  

## 2015-07-31 DIAGNOSIS — G9332 Myalgic encephalomyelitis/chronic fatigue syndrome: Secondary | ICD-10-CM | POA: Insufficient documentation

## 2015-07-31 DIAGNOSIS — E039 Hypothyroidism, unspecified: Secondary | ICD-10-CM | POA: Insufficient documentation

## 2015-07-31 DIAGNOSIS — F411 Generalized anxiety disorder: Secondary | ICD-10-CM | POA: Insufficient documentation

## 2015-07-31 DIAGNOSIS — K760 Fatty (change of) liver, not elsewhere classified: Secondary | ICD-10-CM | POA: Insufficient documentation

## 2015-07-31 DIAGNOSIS — M810 Age-related osteoporosis without current pathological fracture: Secondary | ICD-10-CM | POA: Insufficient documentation

## 2015-07-31 DIAGNOSIS — E559 Vitamin D deficiency, unspecified: Secondary | ICD-10-CM | POA: Insufficient documentation

## 2015-08-25 ENCOUNTER — Encounter: Payer: Self-pay | Admitting: Internal Medicine

## 2015-08-25 ENCOUNTER — Ambulatory Visit (INDEPENDENT_AMBULATORY_CARE_PROVIDER_SITE_OTHER): Payer: Medicare HMO | Admitting: Internal Medicine

## 2015-08-25 VITALS — BP 124/60 | HR 84 | Ht 64.0 in | Wt 178.4 lb

## 2015-08-25 DIAGNOSIS — R1310 Dysphagia, unspecified: Secondary | ICD-10-CM | POA: Diagnosis not present

## 2015-08-25 DIAGNOSIS — K219 Gastro-esophageal reflux disease without esophagitis: Secondary | ICD-10-CM | POA: Diagnosis not present

## 2015-08-25 DIAGNOSIS — K589 Irritable bowel syndrome without diarrhea: Secondary | ICD-10-CM | POA: Diagnosis not present

## 2015-08-25 DIAGNOSIS — Z86010 Personal history of colonic polyps: Secondary | ICD-10-CM | POA: Diagnosis not present

## 2015-08-25 MED ORDER — NA SULFATE-K SULFATE-MG SULF 17.5-3.13-1.6 GM/177ML PO SOLN: ORAL | Status: DC

## 2015-08-25 NOTE — Progress Notes (Signed)
HISTORY OF PRESENT ILLNESS:  Dominique Gillespie is a 75 y.o. female who presents herself today regarding management of GERD, persistent globus type sensation, and the need for surveillance colonoscopy. She was last evaluated in the office 12/28/2014 for irritable bowel syndrome with a tendency toward constipation with gas and bloating as well as management of GERD and chronic right upper quadrant pain felt to be musculoskeletal. At that time she was taking PPI every other day and reported doing well. She tells me that she had an episode in the emergency room where she vitamin gets stuck in the pharynx. Subsequently has described a globus sensation. She notes "narrowing in the upper esophagus". This without meals or without relationship swallowing. She does notice that symptom is worse with stress. She saw ENT who change the timing and dosing of her PPI. She thought this helped temporarily. More recently saw her PCP who increased PPI to twice a day over the past month. This appointment arranged. She continues with gas and bloating with the tendency toward constipation. She has been changing her diet with less acidic foods stating that this helps. She has had weight gain. She is interested in upper endoscopy to evaluate swallowing issues. Her last colonoscopy was in December 2012 with tubular adenoma. For follow-up this year. Wanting exams concurrently.  REVIEW OF SYSTEMS:  All non-GI ROS negative except for sinus allergy, anxiety, depression, headaches, muscle cramps, sleeping problems, ankle swelling  Past Medical History  Diagnosis Date  . Tachycardia   . Hypertension   . Hyperlipidemia   . Hypothyroid   . Tendonitis of foot   . Palpitations   . Tubulovillous adenoma polyp of colon   . Interstitial cystitis   . Spondylarthrosis   . GERD (gastroesophageal reflux disease)   . Colon polyps     adenomatous  . IBS (irritable bowel syndrome)   . Hemorrhoids   . Hiatal hernia   . Fibromyalgia   .  Anxiety     mild  . Vertigo, benign positional   . Osteoporosis   . Scoliosis     Past Surgical History  Procedure Laterality Date  . Tonsillectomy    . Tubal ligation    . Colonoscopy  2012  . Dilation and curettage of uterus    . Vein surgery      laser    Social History HEYLEY WARDEN  reports that she quit smoking about 56 years ago. Her smoking use included Cigarettes. She quit after .5 years of use. She has never used smokeless tobacco. She reports that she drinks about 2.4 oz of alcohol per week. She reports that she does not use illicit drugs.  family history includes Breast cancer in her mother; Heart disease in her father; Ulcers in her father. There is no history of Colon cancer, Esophageal cancer, Pancreatic cancer, Stomach cancer, Kidney disease, Liver disease, or Diabetes.  Allergies  Allergen Reactions  . Carbocaine [Mepivacaine Hcl] Hives  . Epinephrine Base Shortness Of Breath and Swelling  . Iodine     IV contrast  . Prolia [Denosumab]     Pitting edema  . Statins   . Sulfonamide Derivatives        PHYSICAL EXAMINATION: Vital signs: BP 124/60 mmHg  Pulse 84  Ht 5\' 4"  (1.626 m)  Wt 178 lb 6.4 oz (80.922 kg)  BMI 30.61 kg/m2  Constitutional: generally well-appearing, no acute distress Psychiatric: alert and oriented x3, cooperative Eyes: extraocular movements intact, anicteric, conjunctiva pink Mouth: oral pharynx moist,  no lesions. Pharynx: Unremarkable posterior pharynx Neck: suppleWithout thyromegaly Lymph no lymphadenopathy, particularly supraclavicular Cardiovascular: heart regular rate and rhythm, no murmur Lungs: clear to auscultation bilaterally Abdomen: soft, nontender, nondistended, no obvious ascites, no peritoneal signs, normal bowel sounds, no organomegaly Rectal: Deferred until colonoscopy Extremities: no clubbing cyanosis or lower extremity edema bilaterally Skin: no lesions on visible extremities Neuro: No focal deficits.  Normal DTRs. Cranial nerves intact.   ASSESSMENT:  #1. GERD. Classic symptoms controlled with PPI #2. Globus sensation. Likely functional. #3. Transient pill dysphagia one occasion. Rule out stricture #4. Personal history of adenomatous colon polyps. Due for surveillance #5. Constipation predominant irritable bowel syndrome. Ongoing   PLAN:  #1. Reflux precautions #2. Continue twice a day PPI for now. May decrease to once daily post EGD #3. Schedule upper endoscopy with possible esophageal dilation.The nature of the procedure, as well as the risks, benefits, and alternatives were carefully and thoroughly reviewed with the patient. Ample time for discussion and questions allowed. The patient understood, was satisfied, and agreed to proceed. #4. Schedule surveillance colonoscopy with polypectomy if necessary.The nature of the procedure, as well as the risks, benefits, and alternatives were carefully and thoroughly reviewed with the patient. Ample time for discussion and questions allowed. The patient understood, was satisfied, and agreed to proceed. #5. Fiber and MiraLAX as needed for constipation #6. Ongoing general medical care with PCP  40 minutes was spent face-to-face with the patient. Greater than 50% a time use for counseling regarding her multiple GI conditions as outlined above and the plans as outlined above

## 2015-08-25 NOTE — Patient Instructions (Addendum)
You have been scheduled for an endoscopy and colonoscopy. Please follow written instructions given to you at your visit today.  Please pick up your prep supplies at the pharmacy within the next 1-3 days. If you use inhalers (even only as needed), please bring them with you on the day of your procedure. Your physician has requested that you go to www.startemmi.com and enter the access code given to you at your visit today. This web site gives a general overview about your procedure. However, you should still follow specific instructions given to you by our office regarding your preparation for the procedure.  If you are age 34 or older, your body mass index should be between 23-30. Your Body mass index is 30.61 kg/(m^2). If this is out of the aforementioned range listed, please consider follow up with your Primary Care Provider.  If you are age 84 or younger, your body mass index should be between 19-25. Your Body mass index is 30.61 kg/(m^2). If this is out of the aformentioned range listed, please consider follow up with your Primary Care Provider.

## 2015-10-03 ENCOUNTER — Encounter: Payer: Self-pay | Admitting: Internal Medicine

## 2015-10-03 ENCOUNTER — Ambulatory Visit (AMBULATORY_SURGERY_CENTER): Payer: Medicare HMO | Admitting: Internal Medicine

## 2015-10-03 VITALS — BP 135/76 | HR 70 | Temp 96.8°F | Resp 12 | Ht 64.0 in | Wt 178.0 lb

## 2015-10-03 DIAGNOSIS — K219 Gastro-esophageal reflux disease without esophagitis: Secondary | ICD-10-CM

## 2015-10-03 DIAGNOSIS — R1310 Dysphagia, unspecified: Secondary | ICD-10-CM | POA: Diagnosis not present

## 2015-10-03 DIAGNOSIS — D122 Benign neoplasm of ascending colon: Secondary | ICD-10-CM | POA: Diagnosis not present

## 2015-10-03 DIAGNOSIS — Z86010 Personal history of colonic polyps: Secondary | ICD-10-CM | POA: Diagnosis not present

## 2015-10-03 DIAGNOSIS — D124 Benign neoplasm of descending colon: Secondary | ICD-10-CM

## 2015-10-03 DIAGNOSIS — D123 Benign neoplasm of transverse colon: Secondary | ICD-10-CM

## 2015-10-03 MED ORDER — SODIUM CHLORIDE 0.9 % IV SOLN: 500.0000 mL | INTRAVENOUS | Status: DC

## 2015-10-03 NOTE — Progress Notes (Signed)
Called to room to assist during endoscopic procedure.  Patient ID and intended procedure confirmed with present staff. Received instructions for my participation in the procedure from the performing physician.  

## 2015-10-03 NOTE — Progress Notes (Signed)
To pacu vss patent aw report to rn 

## 2015-10-03 NOTE — Op Note (Signed)
[facility] Patient Name: Dominique Gillespie Procedure Date: 10/03/2015 2:53 PM MRN: [id number] Endoscopist: Docia Chuck. Henrene Pastor , MD Age: 75 Date of Birth: 1940/05/29 Gender: Female Procedure:                Upper GI endoscopy Indications:              Dysphagia, globus type sensation Medicines:                Monitored Anesthesia Care Procedure:                Pre-Anesthesia Assessment:                           - Prior to the procedure, a History and Physical                            was performed, and patient medications and                            allergies were reviewed. The patient's tolerance of                            previous anesthesia was also reviewed. The risks                            and benefits of the procedure and the sedation                            options and risks were discussed with the patient.                            All questions were answered, and informed consent                            was obtained. Prior Anticoagulants: The patient has                            taken no previous anticoagulant or antiplatelet                            agents. ASA Grade Assessment: II - A patient with                            mild systemic disease. After reviewing the risks                            and benefits, the patient was deemed in                            satisfactory condition to undergo the procedure.                           After obtaining informed consent, the endoscope was  passed under direct vision. Throughout the                            procedure, the patient's blood pressure, pulse, and                            oxygen saturations were monitored continuously. The                            Model GIF-HQ190 [id number]) scope was introduced                            through the mouth, and advanced to the second part                            of duodenum. The upper GI endoscopy was             accomplished without difficulty. The patient                            tolerated the procedure well. Scope In: Scope Out: Findings:                 The esophagus was normal.                           The stomach was normal.                           The examined duodenum was normal.                           The cardia and gastric fundus were normal on                            retroflexion. Complications:            No immediate complications. Estimated Blood Loss:     Estimated blood loss: none. Impression:               - Normal esophagus.                           - Normal stomach.                           - Normal examined duodenum.                           - No specimens collected. Recommendation:           - Resume previous diet.                           - Continue present medications. Docia Chuck. Henrene Pastor, MD 10/03/2015 3:04:07 PM This report has been signed electronically. CC Letter to:             Cleone Slim. Rozetta Nunnery

## 2015-10-03 NOTE — Op Note (Signed)
[facility] Patient Name: Dominique Gillespie Procedure Date: 10/03/2015 2:17 PM MRN: [id number] Endoscopist: Docia Chuck. Henrene Pastor , MD Age: 75 Date of Birth: [birth year] Gender: Female Procedure:                Colonoscopy, with cold snare polypectomy -5 Indications:              Surveillance: Personal history of adenomatous                            polyps on last colonoscopy 5 years ago. Small                            tubular adenoma December 2012 Medicines:                Monitored Anesthesia Care Procedure:                Pre-Anesthesia Assessment:                           - Prior to the procedure, a History and Physical                            was performed, and patient medications and                            allergies were reviewed. The patient's tolerance of                            previous anesthesia was also reviewed. The risks                            and benefits of the procedure and the sedation                            options and risks were discussed with the patient.                            All questions were answered, and informed consent                            was obtained. Prior Anticoagulants: The patient has                            taken no previous anticoagulant or antiplatelet                            agents. ASA Grade Assessment: II - A patient with                            mild systemic disease. After reviewing the risks                            and benefits, the patient was deemed in  satisfactory condition to undergo the procedure.                           After obtaining informed consent, the colonoscope                            was passed under direct vision. Throughout the                            procedure, the patient's blood pressure, pulse, and                            oxygen saturations were monitored continuously. The                            Model CF-HQ190L [id number]) scope was  introduced                            through the anus and advanced to the the cecum,                            identified by appendiceal orifice and ileocecal                            valve. The ileocecal valve, appendiceal orifice,                            and rectum were photographed. The quality of the                            bowel preparation was excellent. The colonoscopy                            was performed without difficulty. The patient                            tolerated the procedure well. The bowel preparation                            used was SUPREP. Scope In: 2:32:04 PM Scope Out: 2:49:58 PM Scope Withdrawal Time: 0 hours 14 minutes 17 seconds  Total Procedure Duration: 0 hours 17 minutes 54 seconds  Findings:                 Five polyps were found in the descending colon,                            transverse colon and ascending colon. The polyps                            were 3 to 7 mm in size. These polyps were removed                            with a cold snare. Resection and retrieval were  complete.                           Diverticula were found in the sigmoid colon.                           The exam was otherwise without abnormality on                            direct and retroflexion views. Complications:            No immediate complications. Estimated blood loss:                            None. Estimated Blood Loss:     Estimated blood loss: none. Impression:               - Five 3 to 7 mm polyps in the descending colon, in                            the transverse colon and in the ascending colon,                            removed with a cold snare. Resected and retrieved.                           - Diverticulosis in the sigmoid colon.                           - The examination was otherwise normal on direct                            and retroflexion views. Recommendation:           - Repeat colonoscopy in 3  years for surveillance.                           - EGD today. Please see report.                           - Await pathology results. Docia Chuck. Henrene Pastor, MD 10/03/2015 3:01:21 PM This report has been signed electronically. CC Letter to:             Cleone Slim. Rozetta Nunnery

## 2015-10-03 NOTE — Patient Instructions (Signed)

## 2015-10-04 ENCOUNTER — Telehealth: Payer: Self-pay | Admitting: *Deleted

## 2015-10-04 NOTE — Telephone Encounter (Signed)
  Follow up Call-  Call back number 10/03/2015  Post procedure Call Back phone  # [phone]  Permission to leave phone message Yes     Patient questions:  Do you have a fever, pain , or abdominal swelling? No. Pain Score  0 *  Have you tolerated food without any problems? Yes.    Have you been able to return to your normal activities? No.  Do you have any questions about your discharge instructions: Diet   No. Medications  No. Follow up visit  No.  Do you have questions or concerns about your Care? No.  Actions: * If pain score is 4 or above: No action needed, pain <4.

## 2015-10-06 ENCOUNTER — Encounter: Payer: Self-pay | Admitting: Internal Medicine

## 2015-11-16 DIAGNOSIS — M62838 Other muscle spasm: Secondary | ICD-10-CM | POA: Insufficient documentation

## 2016-06-24 DIAGNOSIS — M791 Myalgia, unspecified site: Secondary | ICD-10-CM | POA: Insufficient documentation

## 2016-06-24 DIAGNOSIS — R7309 Other abnormal glucose: Secondary | ICD-10-CM | POA: Insufficient documentation

## 2017-04-02 IMAGING — CT CT NECK W/O CM
5 of 7 series · 15 of 33 positions shown, 16 images · non-contrast
Comparison: None.

CLINICAL DATA: Chronic facial and soft tissue neck pain. Root canal
10/19/2014. Postoperative swelling.

EXAM:
CT MAXILLOFACIAL WITHOUT CONTRAST
CT NECK WITHOUT CONTRAST
TECHNIQUE: Multidetector CT imaging of the maxillofacial structures was
performed. Multiplanar CT image reconstructions were also generated.
A small metallic BB was placed on the right temple in order to
reliably differentiate right from left.
Multidetector CT imaging of the neck was performed following the
standard protocol without intravenous contrast.

[Series 3: maxillofacial 2.0 h30s st · axial · 0.34mm/px · z∈[-195,-109]mm · 3 of 87 slices shown]
[im 22/87  bone]
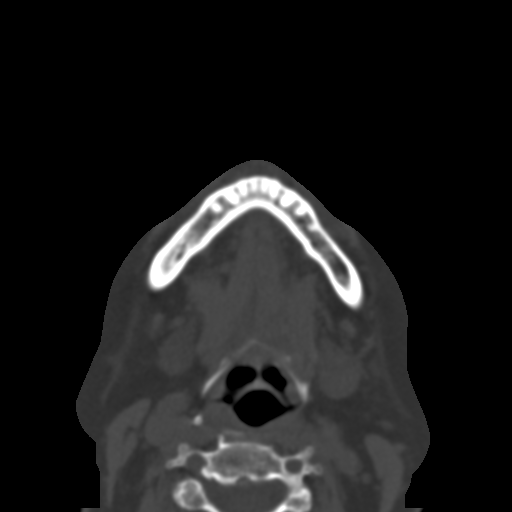
[im 44/87  bone]
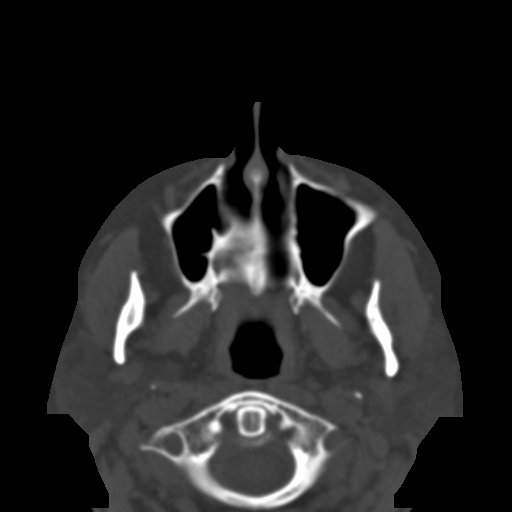
[im 65/87  bone]
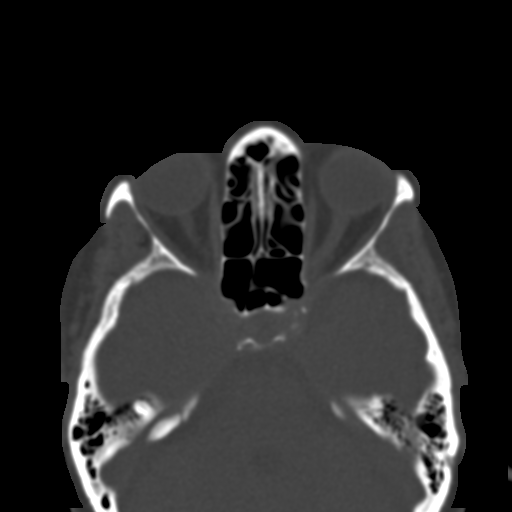

[Series 10: neck 2.0 b31s · axial · 0.44mm/px · z∈[-250,-192]mm · 2 of 89 slices shown]
[im 30/89  bone]
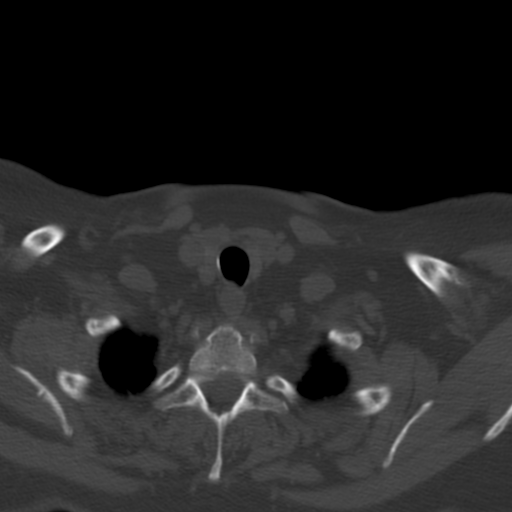
[im 59/89  bone]
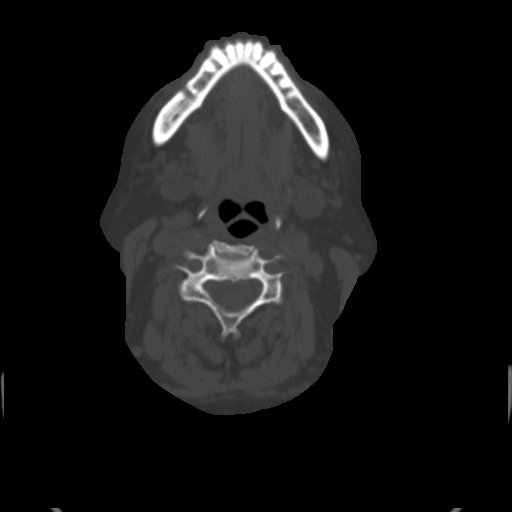

[Series 12: neck 2.0 coronal · coronal · 0.36mm/px · 2 of 115 slices shown]
[im 39/115  bone]
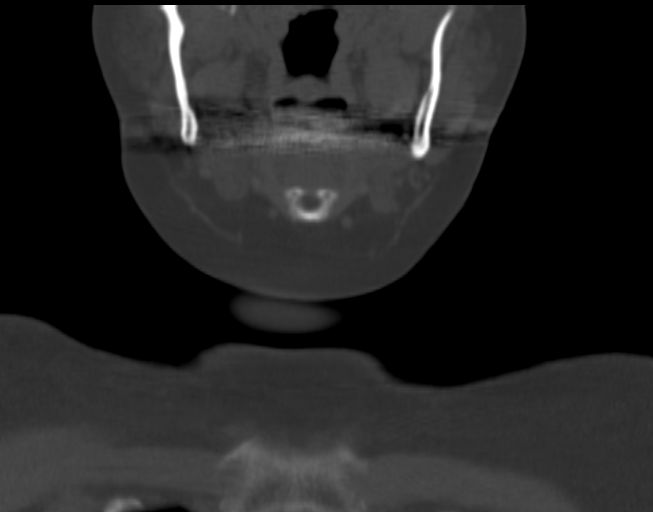
[im 77/115  bone]
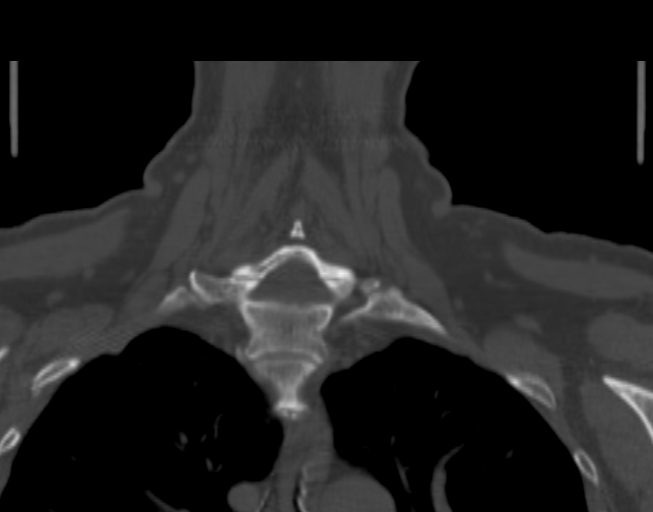

[Series 13: neck 2.0 sagittal · sagittal · 0.36mm/px · 5 of 108 slices shown]
[im 18/108  bone]
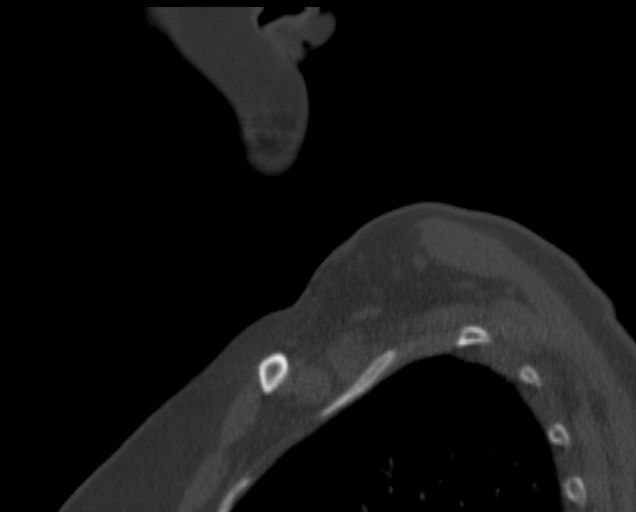
[im 36/108  bone]
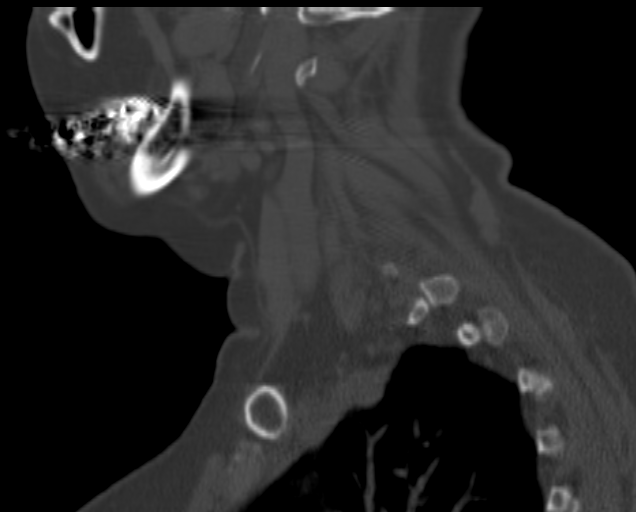
[im 54/108  bone]
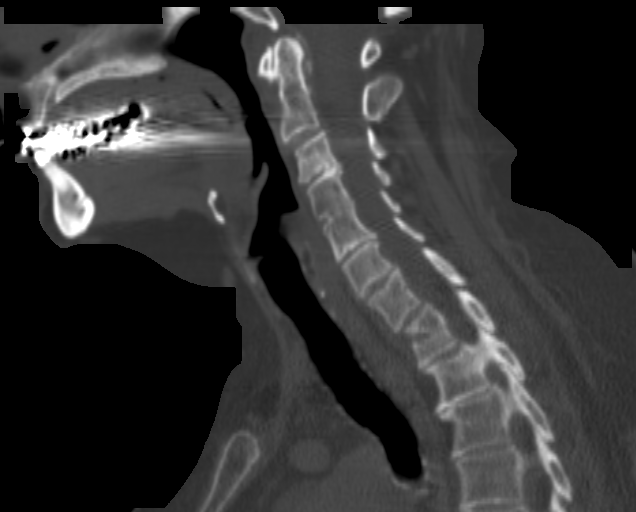
[im 72/108  bone]
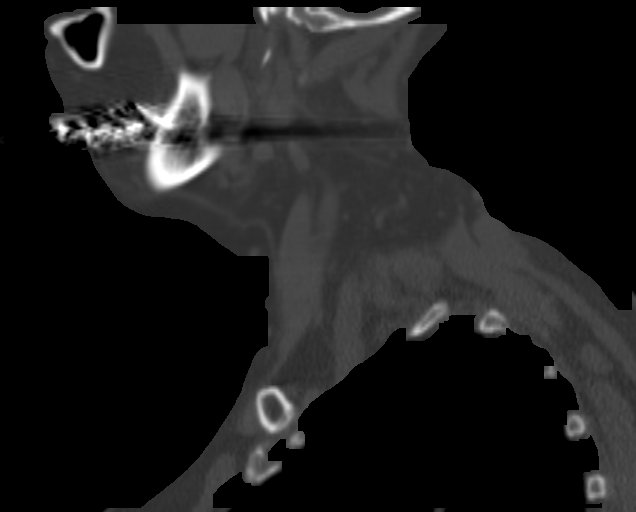
[im 90/108  bone]
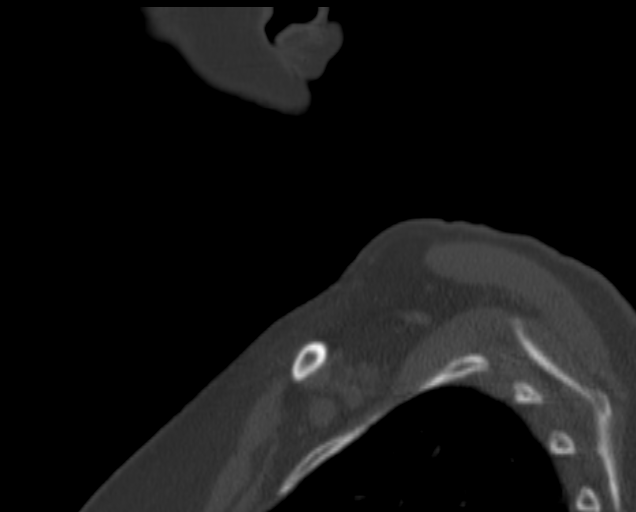

[Series 15: neck 2.0 orth axial to hyoid · axial · 0.42mm/px · z∈[-312,-211]mm · 3 of 107 slices shown, 4 images]
[im 27/107  soft-tissue]
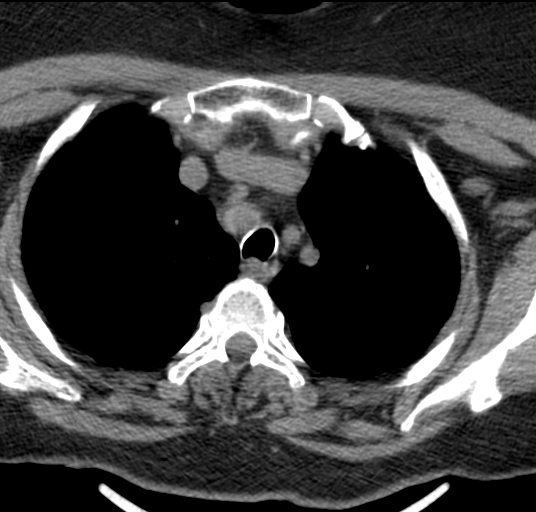
[im 27/107  bone]
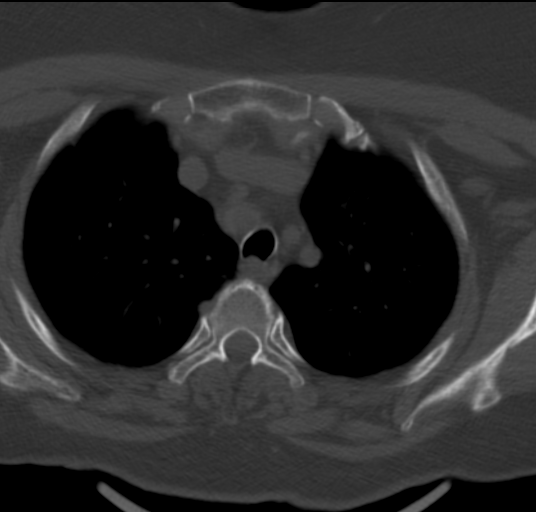
[im 54/107  bone]
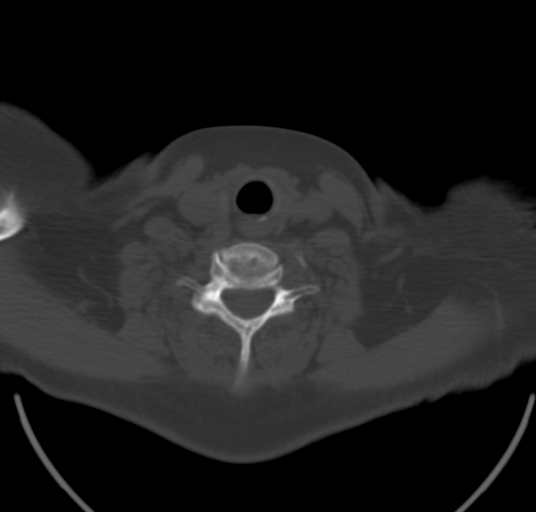
[im 80/107  bone]
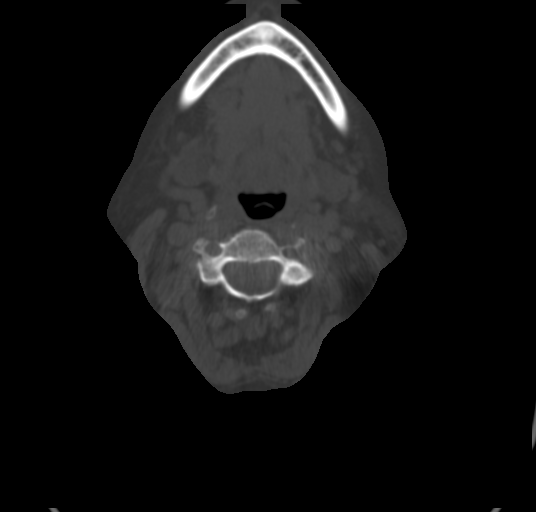

[15 of 33 positions shown; findings below may reference images not displayed]

FINDINGS: The paranasal sinuses and mastoid air cells are clear. Orbits are
unremarkable. The visualized portion of the brain is unremarkable.

There is extensive metallic dental streak artifact which severely
limits evaluation of the oral cavity and overlying soft tissues of
the face. There is a small periapical lucency at the tip of the
right maxillary first molar with a small defect in the maxillary
buccal cortex. No fluid collection or significant inflammatory
changes are identified in the overlying soft tissues.

No significant the subcutaneous fat infiltration is seen in the face
(allowing for limitation by a metallic artifact) or neck aside from
minimal stranding in the submental fat.

Submandibular and parotid glands are unremarkable. Thyroid gland is
somewhat small diffusely without focal abnormality identified. No
enlarged lymph nodes are identified in the neck. The visualized lung
apices are clear. Multilevel disc degeneration is present in the
cervical spine. There is intervertebral osseous fusion at C4-5.
IMPRESSION: Examination partially limited by extensive metallic dental streak
artifact. At most minimal subcutaneous fat infiltration at the level
of the chin. No fluid collection.

## 2017-04-22 DIAGNOSIS — H9113 Presbycusis, bilateral: Secondary | ICD-10-CM | POA: Insufficient documentation

## 2017-10-01 DIAGNOSIS — R600 Localized edema: Secondary | ICD-10-CM | POA: Insufficient documentation

## 2018-09-16 ENCOUNTER — Telehealth: Payer: Self-pay | Admitting: Internal Medicine

## 2018-09-16 NOTE — Telephone Encounter (Signed)
Pls call pt, she needs some advise, she thinks that she popped a hemorrhoid last Sunday, she stated that this has happened before and she is having the same sxs so would like some advise on what to do.

## 2018-09-17 NOTE — Telephone Encounter (Signed)
Pt states her IBS has been bothering her. She has had some hard stool then later had liquid stool. She has seen some BRB in the toilet water and on the tissue. Pt states she has internal hemorrhoids. Discussed with pt that she can try some OTC prep h suppositories for her hemorrhoids. Pt verbalized understanding and knows to call back if she continues to have problems.

## 2019-04-13 DIAGNOSIS — H353131 Nonexudative age-related macular degeneration, bilateral, early dry stage: Secondary | ICD-10-CM | POA: Insufficient documentation

## 2019-04-13 DIAGNOSIS — H524 Presbyopia: Secondary | ICD-10-CM | POA: Insufficient documentation

## 2019-04-29 ENCOUNTER — Encounter: Payer: Medicare HMO | Admitting: Internal Medicine

## 2019-05-18 ENCOUNTER — Encounter: Payer: Medicare HMO | Admitting: Internal Medicine

## 2019-06-21 ENCOUNTER — Encounter: Payer: Self-pay | Admitting: Internal Medicine

## 2019-06-24 ENCOUNTER — Other Ambulatory Visit: Payer: Self-pay

## 2019-06-24 ENCOUNTER — Ambulatory Visit (AMBULATORY_SURGERY_CENTER): Payer: Self-pay | Admitting: *Deleted

## 2019-06-24 VITALS — Temp 97.5°F | Ht 64.0 in | Wt 180.0 lb

## 2019-06-24 DIAGNOSIS — Z860100 Personal history of colon polyps, unspecified: Secondary | ICD-10-CM

## 2019-06-24 DIAGNOSIS — Z01818 Encounter for other preprocedural examination: Secondary | ICD-10-CM

## 2019-06-24 MED ORDER — SUPREP BOWEL PREP KIT 17.5-3.13-1.6 GM/177ML PO SOLN: 1.0000 | Freq: Once | ORAL | 0 refills | Status: AC

## 2019-06-24 NOTE — Progress Notes (Signed)
No egg or soy allergy known to patient  No issues with past sedation with any surgeries  or procedures, no intubation problems  No diet pills per patient No home 02 use per patient  No blood thinners per patient  Pt denies issues with constipation - uses Miralax as needed- no issues with chronic constipation  No A fib or A flutter  EMMI video sent to pt's e mail   Due to the COVID-19 pandemic we are asking patients to follow these guidelines. Please only bring one care partner. Please be aware that your care partner may wait in the car in the parking lot or if they feel like they will be too hot to wait in the car, they may wait in the lobby on the 4th floor. All care partners are required to wear a mask the entire time (we do not have any that we can provide them), they need to practice social distancing, and we will do a Covid check for all patient's and care partners when you arrive. Also we will check their temperature and your temperature. If the care partner waits in their car they need to stay in the parking lot the entire time and we will call them on their cell phone when the patient is ready for discharge so they can bring the car to the front of the building. Also all patient's will need to wear a mask into building.

## 2019-06-28 ENCOUNTER — Telehealth: Payer: Self-pay | Admitting: Internal Medicine

## 2019-06-28 NOTE — Telephone Encounter (Signed)
Spoke with pt and she is aware and will call back when she is able to schedule.

## 2019-06-28 NOTE — Telephone Encounter (Signed)
Pt stated that she is aware that she is a year overdue for recall colon 09/25/2018.  Pt informed that her husband had broken his foot and that pt does not have a chaperone for procedure.  Pt would like to know whether it would be OK to wait a couple months before rescheduling colonoscopy.

## 2019-06-28 NOTE — Telephone Encounter (Signed)
Okay to wait a couple of months

## 2019-06-28 NOTE — Telephone Encounter (Signed)
Dr. Henrene Pastor please see the note below and advise.

## 2019-07-08 ENCOUNTER — Encounter: Payer: Medicare HMO | Admitting: Internal Medicine

## 2019-08-23 DIAGNOSIS — N950 Postmenopausal bleeding: Secondary | ICD-10-CM | POA: Insufficient documentation

## 2019-08-31 DIAGNOSIS — R7303 Prediabetes: Secondary | ICD-10-CM | POA: Insufficient documentation

## 2019-09-16 ENCOUNTER — Encounter: Payer: Self-pay | Admitting: Internal Medicine

## 2019-10-18 ENCOUNTER — Ambulatory Visit (AMBULATORY_SURGERY_CENTER): Payer: Self-pay | Admitting: *Deleted

## 2019-10-18 ENCOUNTER — Other Ambulatory Visit: Payer: Self-pay

## 2019-10-18 VITALS — Ht 64.0 in | Wt 180.0 lb

## 2019-10-18 DIAGNOSIS — Z86010 Personal history of colonic polyps: Secondary | ICD-10-CM

## 2019-10-18 NOTE — Progress Notes (Signed)
Pt has suprep at home from 06-24-19 [facility]  07-08-2019 comp covid vaccines    No egg or soy allergy known to patient  No issues with past sedation with any surgeries  or procedures, no intubation problems  No diet pills per patient No home 02 use per patient  No blood thinners per patient  Pt denies issues with constipation  No A fib or A flutter  EMMI video sent to pt's e mail   Due to the COVID-19 pandemic we are asking patients to follow these guidelines. Please only bring one care partner. Please be aware that your care partner may wait in the car in the parking lot or if they feel like they will be too hot to wait in the car, they may wait in the lobby on the 4th floor. All care partners are required to wear a mask the entire time (we do not have any that we can provide them), they need to practice social distancing, and we will do a Covid check for all patient's and care partners when you arrive. Also we will check their temperature and your temperature. If the care partner waits in their car they need to stay in the parking lot the entire time and we will call them on their cell phone when the patient is ready for discharge so they can bring the car to the front of the building. Also all patient's will need to wear a mask into building.

## 2019-10-19 ENCOUNTER — Encounter: Payer: Self-pay | Admitting: Internal Medicine

## 2019-11-02 ENCOUNTER — Encounter: Payer: Self-pay | Admitting: Internal Medicine

## 2019-11-02 ENCOUNTER — Encounter: Payer: Medicare HMO | Admitting: Internal Medicine

## 2019-11-02 ENCOUNTER — Other Ambulatory Visit: Payer: Self-pay

## 2019-11-02 ENCOUNTER — Ambulatory Visit (AMBULATORY_SURGERY_CENTER): Payer: Medicare HMO | Admitting: Internal Medicine

## 2019-11-02 VITALS — BP 98/58 | HR 61 | Temp 96.8°F | Resp 13 | Ht 64.0 in | Wt 180.0 lb

## 2019-11-02 DIAGNOSIS — Z86010 Personal history of colonic polyps: Secondary | ICD-10-CM | POA: Diagnosis not present

## 2019-11-02 DIAGNOSIS — D122 Benign neoplasm of ascending colon: Secondary | ICD-10-CM | POA: Diagnosis not present

## 2019-11-02 MED ORDER — SODIUM CHLORIDE 0.9 % IV SOLN: 500.0000 mL | Freq: Once | INTRAVENOUS | Status: AC

## 2019-11-02 NOTE — Progress Notes (Signed)
A/ox3, pleased with MAC, report to RN 

## 2019-11-02 NOTE — Progress Notes (Signed)
VS  DT ? ?Pt's states no medical or surgical changes since previsit or office visit. ? ?

## 2019-11-02 NOTE — Op Note (Signed)
[facility] Patient Name: Dominique Gillespie Procedure Date: 11/02/2019 9:35 AM MRN: [id number] Endoscopist: Docia Chuck. Henrene Pastor , MD Age: 79 Referring MD:  Date of Birth: [birth year] Gender: Female Account #: [id number] Procedure:                Colonoscopy with cold snare polypectomy x 1 Indications:              High risk colon cancer surveillance: Personal                            history of multiple (3 or more) adenomas. Most                            recent examinations 2012 and 2017 Medicines:                Monitored Anesthesia Care Procedure:                Pre-Anesthesia Assessment:                           - Prior to the procedure, a History and Physical                            was performed, and patient medications and                            allergies were reviewed. The patient's tolerance of                            previous anesthesia was also reviewed. The risks                            and benefits of the procedure and the sedation                            options and risks were discussed with the patient.                            All questions were answered, and informed consent                            was obtained. Prior Anticoagulants: The patient has                            taken no previous anticoagulant or antiplatelet                            agents. ASA Grade Assessment: II - A patient with                            mild systemic disease. After reviewing the risks                            and benefits, the patient was deemed in  satisfactory condition to undergo the procedure.                           After obtaining informed consent, the colonoscope                            was passed under direct vision. Throughout the                            procedure, the patient's blood pressure, pulse, and                            oxygen saturations were monitored continuously. The   Colonoscope was introduced through the anus and                            advanced to the the cecum, identified by                            appendiceal orifice and ileocecal valve. The                            ileocecal valve, appendiceal orifice, and rectum                            were photographed. The quality of the bowel                            preparation was excellent. The colonoscopy was                            performed without difficulty. The patient tolerated                            the procedure well. The bowel preparation used was                            SUPREP via split dose instruction. Scope In: 9:42:16 AM Scope Out: 9:55:57 AM Scope Withdrawal Time: 0 hours 8 minutes 47 seconds  Total Procedure Duration: 0 hours 13 minutes 41 seconds  Findings:                 A 2 mm polyp was found in the ascending colon. The                            polyp was removed with a cold snare. Resection and                            retrieval were complete.                           A few diverticula were found in the sigmoid colon.                           Internal hemorrhoids  were found during                            retroflexion. The hemorrhoids were small.                           The exam was otherwise without abnormality on                            direct and retroflexion views. Complications:            No immediate complications. Estimated blood loss:                            None. Estimated Blood Loss:     Estimated blood loss: none. Impression:               - One 2 mm polyp in the ascending colon, removed                            with a cold snare. Resected and retrieved.                           - Diverticulosis in the sigmoid colon.                           - Internal hemorrhoids.                           - The examination was otherwise normal on direct                            and retroflexion views. Recommendation:           - Repeat  colonoscopy is not recommended for                            surveillance (based on current age and favorable                            findings).                           - Patient has a contact number available for                            emergencies. The signs and symptoms of potential                            delayed complications were discussed with the                            patient. Return to normal activities tomorrow.                            Written discharge instructions were provided to the  patient.                           - Resume previous diet.                           - Continue present medications.                           - Await pathology results. Docia Chuck. Henrene Pastor, MD 11/02/2019 10:01:07 AM This report has been signed electronically.

## 2019-11-02 NOTE — Progress Notes (Signed)
Called to room to assist during endoscopic procedure.  Patient ID and intended procedure confirmed with present staff. Received instructions for my participation in the procedure from the performing physician.  

## 2019-11-02 NOTE — Patient Instructions (Signed)
YOU HAD AN ENDOSCOPIC PROCEDURE TODAY AT THE [facility]:   Refer to the procedure report that was given to you for any specific questions about what was found during the examination.  If the procedure report does not answer your questions, please call your gastroenterologist to clarify.  If you requested that your care partner not be given the details of your procedure findings, then the procedure report has been included in a sealed envelope for you to review at your convenience later.  YOU SHOULD EXPECT: Some feelings of bloating in the abdomen. Passage of more gas than usual.  Walking can help get rid of the air that was put into your GI tract during the procedure and reduce the bloating. If you had a lower endoscopy (such as a colonoscopy or flexible sigmoidoscopy) you may notice spotting of blood in your stool or on the toilet paper. If you underwent a bowel prep for your procedure, you may not have a normal bowel movement for a few days.  Please Note:  You might notice some irritation and congestion in your nose or some drainage.  This is from the oxygen used during your procedure.  There is no need for concern and it should clear up in a day or so.  SYMPTOMS TO REPORT IMMEDIATELY:   Following lower endoscopy (colonoscopy or flexible sigmoidoscopy):  Excessive amounts of blood in the stool  Significant tenderness or worsening of abdominal pains  Swelling of the abdomen that is new, acute  Fever of 100F or higher   For urgent or emergent issues, a gastroenterologist can be reached at any hour by calling [phone]. Do not use MyChart messaging for urgent concerns.    DIET:  We do recommend a small meal at first, but then you may proceed to your regular diet.  Drink plenty of fluids but you should avoid alcoholic beverages for 24 hours.  MEDICATIONS: Continue present medications.  Please see handouts given to you by your recovery nurse.  ACTIVITY:  You should plan to  take it easy for the rest of today and you should NOT DRIVE or use heavy machinery until tomorrow (because of the sedation medicines used during the test).    FOLLOW UP: Our staff will call the number listed on your records 48-72 hours following your procedure to check on you and address any questions or concerns that you may have regarding the information given to you following your procedure. If we do not reach you, we will leave a message.  We will attempt to reach you two times.  During this call, we will ask if you have developed any symptoms of COVID 19. If you develop any symptoms (ie: fever, flu-like symptoms, shortness of breath, cough etc.) before then, please call [phone].  If you test positive for Covid 19 in the 2 weeks post procedure, please call and report this information to us.    If any biopsies were taken you will be contacted by phone or by letter within the next 1-3 weeks.  Please call us at [phone] if you have not heard about the biopsies in 3 weeks.   Thank you for allowing us to provide for your healthcare needs today.   SIGNATURES/CONFIDENTIALITY: You and/or your care partner have signed paperwork which will be entered into your electronic medical record.  These signatures attest to the fact that that the information above on your After Visit Summary has been reviewed and is understood.  Full responsibility of the   confidentiality of this discharge information lies with you and/or your care-partner. 

## 2019-11-04 ENCOUNTER — Telehealth: Payer: Self-pay

## 2019-11-04 NOTE — Telephone Encounter (Signed)
°  Follow up Call-  Call back number 11/02/2019  Post procedure Call Back phone  # [phone]  Permission to leave phone message Yes  Some recent data might be hidden     Patient questions:  Do you have a fever, pain , or abdominal swelling? No. Pain Score  0 *  Have you tolerated food without any problems? Yes.    Have you been able to return to your normal activities? Yes.    Do you have any questions about your discharge instructions: Diet   No. Medications  No. Follow up visit  No.  Do you have questions or concerns about your Care? No.  Actions: * If pain score is 4 or above: No action needed, pain <4.  1. Have you developed a fever since your procedure? no  2.   Have you had an respiratory symptoms (SOB or cough) since your procedure? no  3.   Have you tested positive for COVID 19 since your procedure no  4.   Have you had any family members/close contacts diagnosed with the COVID 19 since your procedure?  no   If yes to any of these questions please route to Joylene John, RN and Erenest Rasher, RN

## 2019-11-05 ENCOUNTER — Encounter: Payer: Self-pay | Admitting: Internal Medicine

## 2020-11-29 ENCOUNTER — Emergency Department (HOSPITAL_BASED_OUTPATIENT_CLINIC_OR_DEPARTMENT_OTHER)
Admission: EM | Admit: 2020-11-29 | Discharge: 2020-11-29 | Disposition: A | Discharge status: Home / Self Care | Payer: Medicare HMO | Attending: Emergency Medicine | Admitting: Emergency Medicine

## 2020-11-29 ENCOUNTER — Emergency Department (HOSPITAL_BASED_OUTPATIENT_CLINIC_OR_DEPARTMENT_OTHER): Payer: Medicare HMO

## 2020-11-29 ENCOUNTER — Other Ambulatory Visit: Payer: Self-pay

## 2020-11-29 ENCOUNTER — Encounter (HOSPITAL_BASED_OUTPATIENT_CLINIC_OR_DEPARTMENT_OTHER): Payer: Self-pay | Admitting: Emergency Medicine

## 2020-11-29 DIAGNOSIS — R519 Headache, unspecified: Secondary | ICD-10-CM | POA: Insufficient documentation

## 2020-11-29 DIAGNOSIS — W07XXXA Fall from chair, initial encounter: Secondary | ICD-10-CM | POA: Diagnosis not present

## 2020-11-29 DIAGNOSIS — Z87891 Personal history of nicotine dependence: Secondary | ICD-10-CM | POA: Diagnosis not present

## 2020-11-29 DIAGNOSIS — S40022A Contusion of left upper arm, initial encounter: Secondary | ICD-10-CM | POA: Diagnosis not present

## 2020-11-29 DIAGNOSIS — E039 Hypothyroidism, unspecified: Secondary | ICD-10-CM | POA: Insufficient documentation

## 2020-11-29 DIAGNOSIS — Z79899 Other long term (current) drug therapy: Secondary | ICD-10-CM | POA: Diagnosis not present

## 2020-11-29 DIAGNOSIS — Z7982 Long term (current) use of aspirin: Secondary | ICD-10-CM | POA: Diagnosis not present

## 2020-11-29 DIAGNOSIS — S79912A Unspecified injury of left hip, initial encounter: Secondary | ICD-10-CM | POA: Diagnosis present

## 2020-11-29 DIAGNOSIS — S76012A Strain of muscle, fascia and tendon of left hip, initial encounter: Secondary | ICD-10-CM | POA: Insufficient documentation

## 2020-11-29 DIAGNOSIS — I10 Essential (primary) hypertension: Secondary | ICD-10-CM | POA: Diagnosis not present

## 2020-11-29 DIAGNOSIS — W19XXXA Unspecified fall, initial encounter: Secondary | ICD-10-CM

## 2020-11-29 NOTE — ED Notes (Signed)
States she fell onto a cement floor, stated she hit her head, at occipital area, no raised areas, no bleeding noted. Also states she hit her left arm, visual inspection of left arm indicates a mild bruising noted at area near left deltoid. CMS of LUE is WNL. Also c/o hitting left hip area at left buttocks, LLE inspection WNL as well. States she took an Advil this am prior to arrival to our ED

## 2020-11-29 NOTE — ED Notes (Signed)
ED Provider at bedside. 

## 2020-11-29 NOTE — ED Notes (Signed)
Patient transported to X-ray 

## 2020-11-29 NOTE — ED Notes (Signed)
Clarification: pt denies taking any blood thinners

## 2020-11-29 NOTE — ED Triage Notes (Signed)
Pt reports falling from a rolling chair yesterday. Pt reports left arm, hip, and side pain. Pt also reports hitting her head.

## 2020-11-29 NOTE — ED Provider Notes (Signed)
[facility] Provider Note   CSN: [id number] Arrival date & time: 11/29/20  1012     History Chief Complaint  Patient presents with   Dominique Gillespie is a 80 y.o. female.  Presents to ER with concern for fall.  Patient reports that fall happened yesterday.  While at dentist office, tried sitting in a rolling chair and hit the ground.  Did not lose consciousness, did not hit her head.  Has not had any neck or back pain.  Has had slight bruising to her left arm, primarily having pain in her left hip.  Worse with movement, able to ambulate.  No nausea or vomiting.  Took some Motrin and had improvement in symptoms.  HPI     Past Medical History:  Diagnosis Date   Allergy    seasonal   Anxiety    mild   Cataract    developing   Cervical (neck) region somatic dysfunction    4-5   Colon polyps    adenomatous   Fibromyalgia    GERD (gastroesophageal reflux disease)    Hemorrhoids    Hiatal hernia    Hyperlipidemia    Hypertension    Hypothyroid    IBS (irritable bowel syndrome)    Interstitial cystitis    Osteoporosis    Palpitations    Scoliosis    Spondylarthrosis    Spondylisthesis    Tachycardia    Tendonitis of foot    Tubulovillous adenoma polyp of colon    Vertigo, benign positional     Patient Active Problem List   Diagnosis Date Noted   Prediabetes 08/31/2019   Postmenopausal bleeding 08/23/2019   Early dry stage nonexudative age-related macular degeneration of both eyes 04/13/2019   Myopia with presbyopia of both eyes 04/13/2019   Lower extremity edema 10/01/2017   Presbycusis of both ears 04/22/2017   Elevated glucose 06/24/2016   Myalgia 06/24/2016   Muscle spasm 11/16/2015   Acquired hypothyroidism 07/31/2015   CFS (chronic fatigue syndrome) 07/31/2015   Fatty (change of) liver, not elsewhere classified 07/31/2015   Generalized anxiety disorder 07/31/2015   Osteoporosis 07/31/2015   Vitamin D deficiency  07/31/2015   Atherosclerotic heart disease of native coronary artery without angina pectoris 12/29/2014   Essential hypertension 12/28/2014   Hyperlipidemia, unspecified 12/28/2014   Unspecified constipation 12/07/2013   AP (abdominal pain) 12/07/2013   Generalized bloating 12/07/2013   Diarrhea 12/02/2012   Chronic interstitial cystitis 07/17/2011   IRRITABLE BOWEL SYNDROME 05/03/2009   DYSPHAGIA 05/03/2009   FLATULENCE-GAS-BLOATING 05/03/2009   GERD 03/22/2009   PERSONAL HX COLONIC POLYPS 03/22/2009    Past Surgical History:  Procedure Laterality Date   COLONOSCOPY  2012   DILATION AND CURETTAGE OF UTERUS     POLYPECTOMY     TONSILLECTOMY     TUBAL LIGATION     UPPER GASTROINTESTINAL ENDOSCOPY     VEIN SURGERY     laser     OB History   No obstetric history on file.     Family History  Problem Relation Age of Onset   Breast cancer Mother    Heart disease Father    Ulcers Father        duodenal   Colon cancer Neg Hx    Esophageal cancer Neg Hx    Pancreatic cancer Neg Hx    Stomach cancer Neg Hx    Kidney disease Neg Hx    Liver disease Neg Hx  Diabetes Neg Hx    Colon polyps Neg Hx    Rectal cancer Neg Hx     Social History   Tobacco Use   Smoking status: Former    Years: 0.50    Pack years: 0.00    Types: Cigarettes    Quit date: 04/23/1959    Years since quitting: 61.6   Smokeless tobacco: Never   Tobacco comments:    x 6 months only   Substance Use Topics   Alcohol use: Yes    Alcohol/week: 4.0 standard drinks    Types: 2 Glasses of wine, 2 Shots of liquor per week    Comment: 5/days a week   Drug use: No    Home Medications Prior to Admission medications   Medication Sig Start Date End Date Taking? Authorizing Provider  alendronate (FOSAMAX) 70 MG tablet Take 70 mg by mouth once a week. 05/22/19   [provider]  Alirocumab (PRALUENT) 150 MG/ML SOPN Inject into the skin. Twice a month    [provider]  ALPRAZolam  (XANAX) 0.5 MG tablet Take 0.5 mg by mouth 3 (three) times daily as needed. 10/26/19   [provider]  AMBULATORY NON FORMULARY MEDICATION Medication Name: Freeze Dried Aloe Vera -Take as needed for I.C.    [provider]  amLODipine (NORVASC) 2.5 MG tablet Take 2.5 mg by mouth daily.    [provider]  aspirin 81 MG tablet Take 81 mg by mouth daily.      [provider]  benazepril-hydrochlorthiazide (LOTENSIN HCT) 10-12.5 MG tablet Take 1 tablet by mouth daily. 11/01/19   [provider]  bifidobacterium infantis (ALIGN) capsule Take 1 capsule by mouth daily. 12/30/14   Irene Shipper, MD  Calcium Glycerophosphate (PRELIEF PO) Take 1 tablet by mouth daily.    [provider]  co-enzyme Q-10 30 MG capsule Take 30 mg by mouth daily.      [provider]  diphenhydrAMINE (BENADRYL) 25 mg capsule Take by mouth.    [provider]  EPINEPHrine 0.3 mg/0.3 mL IJ SOAJ injection Inject into the muscle. 03/26/17   [provider]  fexofenadine (ALLEGRA) 180 MG tablet Take 180 mg by mouth daily.    [provider]  fluticasone (VERAMYST) 27.5 MCG/SPRAY nasal spray Place 2 sprays into the nose daily.    [provider]  levothyroxine (SYNTHROID, LEVOTHROID) 50 MCG tablet Take 50 mcg by mouth daily before breakfast.    [provider]  metoprolol (LOPRESSOR) 50 MG tablet Take 50 mg by mouth 2 (two) times daily.    [provider]  sertraline (ZOLOFT) 50 MG tablet Take 50 mg by mouth 2 (two) times daily. 09/15/19   [provider]  sodium chloride (OCEAN) 0.65 % nasal spray 1 spray as needed for congestion.    [provider]  traMADol (ULTRAM) 50 MG tablet SMARTSIG:1 Tablet(s) By Mouth As Needed 05/14/19   [provider]  valACYclovir (VALTREX) 1000 MG tablet Take 1,000 mg by mouth daily as needed.     [provider]  Vitamin D, Ergocalciferol, (DRISDOL) 50000  units CAPS capsule Take 50,000 Units by mouth every 7 (seven) days.    [provider]    Allergies    Carbocaine [mepivacaine hcl], Epinephrine base, Nitrofurantoin, Albumin human, Iodine, Prolia [denosumab], Statins, and Sulfonamide derivatives  Review of Systems   Review of Systems  Constitutional:  Negative for chills and fever.  HENT:  Negative for ear pain  and sore throat.   Eyes:  Negative for pain and visual disturbance.  Respiratory:  Negative for cough and shortness of breath.   Cardiovascular:  Negative for chest pain and palpitations.  Gastrointestinal:  Negative for abdominal pain and vomiting.  Genitourinary:  Negative for dysuria and hematuria.  Musculoskeletal:  Positive for arthralgias. Negative for back pain.  Skin:  Negative for color change and rash.  Neurological:  Positive for headaches. Negative for seizures and syncope.  All other systems reviewed and are negative.  Physical Exam Updated Vital Signs BP (!) 180/66 (BP Location: Right Arm)   Pulse 66   Temp 98.1 F (36.7 C) (Oral)   Resp 18   Ht 5\' 4"  (1.626 m)   Wt 82.6 kg   SpO2 99%   BMI 31.24 kg/m   Physical Exam Vitals and nursing note reviewed.  Constitutional:      General: She is not in acute distress.    Appearance: She is well-developed.  HENT:     Head: Normocephalic and atraumatic.  Eyes:     Conjunctiva/sclera: Conjunctivae normal.  Cardiovascular:     Rate and Rhythm: Normal rate and regular rhythm.     Heart sounds: No murmur heard. Pulmonary:     Effort: Pulmonary effort is normal. No respiratory distress.  Abdominal:     Palpations: Abdomen is soft.     Tenderness: There is no abdominal tenderness.  Musculoskeletal:     Cervical back: Normal range of motion and neck supple. No rigidity or tenderness.     Comments: Left upper extremity: There is no tenderness to palpation throughout extremity, normal joint range of motion, normal radial pulse  Left lower extremity:  There is mild tenderness over the left hip, normal hip range of motion, pelvis is stable, normal DP and PT pulses, sensation intact  Back: There is no tenderness over the C, T, L-spine  Skin:    General: Skin is warm and dry.  Neurological:     General: No focal deficit present.     Mental Status: She is alert.  Psychiatric:        Mood and Affect: Mood normal.        Behavior: Behavior normal.    ED Results / Procedures / Treatments   Labs (all labs ordered are listed, but only abnormal results are displayed) Labs Reviewed - No data to display  EKG None  Radiology CT Head Wo Contrast  Result Date: 11/29/2020 CLINICAL DATA:  Head injury last night.  Altered mental status. EXAM: CT HEAD WITHOUT CONTRAST TECHNIQUE: Contiguous axial images were obtained from the base of the skull through the vertex without intravenous contrast. COMPARISON:  CT head 04/25/2009. FINDINGS: Brain: There is no evidence of acute intracranial hemorrhage, mass lesion, brain edema or extra-axial fluid collection. Mild atrophy with mild prominence of the ventricles and subarachnoid spaces. There is no CT evidence of acute cortical infarction. Vascular: Mild intracranial atherosclerosis. No hyperdense vessel identified. Skull: Negative for fracture or focal lesion. Sinuses/Orbits: The visualized paranasal sinuses and mastoid air cells are clear. No orbital abnormalities are seen. Other: None. IMPRESSION: No acute intracranial or calvarial findings. Electronically Signed   By: Richardean Sale M.D.   On: 11/29/2020 11:55   DG Hip Unilat W or Wo Pelvis 2-3 Views Left  Result Date: 11/29/2020 CLINICAL DATA:  Left hip pain after fall 1 day ago EXAM: DG HIP (WITH OR WITHOUT PELVIS) 2-3V LEFT COMPARISON:  None. FINDINGS: There is no evidence of hip  fracture or dislocation. Mild osteoarthritis of the bilateral hips, left slightly greater than right. IMPRESSION: No acute fracture or dislocation of the left hip. Mild  osteoarthritis. Electronically Signed   By: Davina Poke D.O.   On: 11/29/2020 12:01    Procedures Procedures   Medications Ordered in ED Medications - No data to display  ED Course  I have reviewed the triage vital signs and the nursing notes.  Pertinent labs & imaging results that were available during my care of the patient were reviewed by me and considered in my medical decision making (see chart for details).    MDM Rules/Calculators/A&P                          80 year old lady presented to ER after mechanical fall yesterday.  She is concerned about hitting her head as well as having some pain in her left hip.  No obvious trauma identified on exam of her face or head.  Given her age and reported head trauma, check CT, negative for acute pathology.  No tenderness of her neck or back.  Did have mild tenderness over the left hip.  Plain films negative.  Remains well-appearing on reassessment, discharged home.   After the discussed management above, the patient was determined to be safe for discharge.  The patient was in agreement with this plan and all questions regarding their care were answered.  ED return precautions were discussed and the patient will return to the ED with any significant worsening of condition.  Final Clinical Impression(s) / ED Diagnoses Final diagnoses:  Fall, initial encounter  Strain of left hip, initial encounter    Rx / DC Orders ED Discharge Orders     None        Lucrezia Starch, MD 11/29/20 1231

## 2020-11-29 NOTE — Discharge Instructions (Addendum)
Follow-up with your primary care doctor.  Come back to ER if you develop vomiting, severe headache, or other new concerning symptom.  Take Tylenol or Motrin as needed for pain.

## 2022-07-12 ENCOUNTER — Emergency Department (HOSPITAL_BASED_OUTPATIENT_CLINIC_OR_DEPARTMENT_OTHER): Payer: Medicare HMO

## 2022-07-12 ENCOUNTER — Encounter (HOSPITAL_BASED_OUTPATIENT_CLINIC_OR_DEPARTMENT_OTHER): Payer: Self-pay

## 2022-07-12 ENCOUNTER — Other Ambulatory Visit: Payer: Self-pay

## 2022-07-12 ENCOUNTER — Emergency Department (HOSPITAL_BASED_OUTPATIENT_CLINIC_OR_DEPARTMENT_OTHER)
Admission: EM | Admit: 2022-07-12 | Discharge: 2022-07-12 | Disposition: A | Discharge status: Home / Self Care | Payer: Medicare HMO | Attending: Emergency Medicine | Admitting: Emergency Medicine

## 2022-07-12 DIAGNOSIS — M25551 Pain in right hip: Secondary | ICD-10-CM | POA: Diagnosis present

## 2022-07-12 DIAGNOSIS — M545 Low back pain, unspecified: Secondary | ICD-10-CM | POA: Insufficient documentation

## 2022-07-12 LAB — CBC WITH DIFFERENTIAL/PLATELET
Abs Immature Granulocytes: 0.03 10*3/uL (ref 0.00–0.07)
Basophils Absolute: 0.1 10*3/uL (ref 0.0–0.1)
Basophils Relative: 1 %
Eosinophils Absolute: 0.3 10*3/uL (ref 0.0–0.5)
Eosinophils Relative: 4 %
HCT: 39.7 % (ref 36.0–46.0)
Hemoglobin: 13.2 g/dL (ref 12.0–15.0)
Immature Granulocytes: 0 %
Lymphocytes Relative: 18 %
Lymphs Abs: 1.2 10*3/uL (ref 0.7–4.0)
MCH: 28.1 pg (ref 26.0–34.0)
MCHC: 33.2 g/dL (ref 30.0–36.0)
MCV: 84.6 fL (ref 80.0–100.0)
Monocytes Absolute: 0.5 10*3/uL (ref 0.1–1.0)
Monocytes Relative: 8 %
Neutro Abs: 4.8 10*3/uL (ref 1.7–7.7)
Neutrophils Relative %: 69 %
Platelets: 301 10*3/uL (ref 150–400)
RBC: 4.69 MIL/uL (ref 3.87–5.11)
RDW: 13.4 % (ref 11.5–15.5)
WBC: 6.9 10*3/uL (ref 4.0–10.5)
nRBC: 0 % (ref 0.0–0.2)

## 2022-07-12 LAB — URINALYSIS, MICROSCOPIC (REFLEX)

## 2022-07-12 LAB — COMPREHENSIVE METABOLIC PANEL
ALT: 14 U/L (ref 0–44)
AST: 15 U/L (ref 15–41)
Albumin: 4 g/dL (ref 3.5–5.0)
Alkaline Phosphatase: 61 U/L (ref 38–126)
Anion gap: 6 (ref 5–15)
BUN: 14 mg/dL (ref 8–23)
CO2: 27 mmol/L (ref 22–32)
Calcium: 9.5 mg/dL (ref 8.9–10.3)
Chloride: 98 mmol/L (ref 98–111)
Creatinine, Ser: 0.91 mg/dL (ref 0.44–1.00)
GFR, Estimated: >60 mL/min (ref >60)
Glucose, Bld: 114 mg/dL — ABNORMAL HIGH (ref 70–99)
Potassium: 4.9 mmol/L (ref 3.5–5.1)
Sodium: 131 mmol/L — ABNORMAL LOW (ref 135–145)
Total Bilirubin: 0.6 mg/dL (ref 0.3–1.2)
Total Protein: 7.2 g/dL (ref 6.5–8.1)

## 2022-07-12 LAB — LIPASE, BLOOD: Lipase: 33 U/L (ref 11–51)

## 2022-07-12 LAB — URINALYSIS, ROUTINE W REFLEX MICROSCOPIC
Bilirubin Urine: NEGATIVE
Glucose, UA: NEGATIVE mg/dL
Hgb urine dipstick: NEGATIVE
Ketones, ur: NEGATIVE mg/dL
Nitrite: NEGATIVE
Protein, ur: NEGATIVE mg/dL
Specific Gravity, Urine: 1.015 (ref 1.005–1.030)
pH: 6 (ref 5.0–8.0)

## 2022-07-12 MED ORDER — METHYLPREDNISOLONE 4 MG PO TBPK: ORAL_TABLET | ORAL | 0 refills | Status: DC

## 2022-07-12 NOTE — ED Triage Notes (Signed)
Pt to er, pt states that she is being treated by her ortho doc, states that her problem is at L4,5,6.  States that she was getting injections with some relief, states that she was last seen in January and stated that she didn't want an injection, states that it was more muscle pain and put on robaxin.  States that two weeks ago she "zigged when I should have zagged" and had some relief of her back pain, but some incontinence for two days.  States that the incontinence has since stopped, but she is having flank pain, denies numbness in legs.

## 2022-07-12 NOTE — ED Provider Notes (Signed)
[facility] Provider Note   CSN: [id number] Arrival date & time: 07/12/22  1449     History  Chief Complaint  Patient presents with   Hip Pain    Dominique Gillespie is a 82 y.o. female.  Patient here with pain in her right hip, right buttocks, feels like her prior arthritis/back issues in the past.  She did not get her most recent pain injection and is scheduled to see them next week.  She denies any numbness, weakness.  Does not have any loss of bowel or bladder but she did have 1 episode of incontinence which may have just been related to pain.  Is not having any issues going to the bathroom now.  She denies any weakness or numbness.  Pain is mostly in her right Cl[place] that wraps around to her right groin.  She is concerned that she might have some intra-abdominal process because the pain radiates into the abdomen.  She denies any chest pain, fever, chills, pain with urination.  The history is provided by the patient.       Home Medications Prior to Admission medications   Medication Sig Start Date End Date Taking? Authorizing Provider  methylPREDNISolone (MEDROL DOSEPAK) 4 MG TBPK tablet Follow package insert 07/12/22  Yes Dezirea Mccollister, DO  alendronate (FOSAMAX) 70 MG tablet Take 70 mg by mouth once a week. 05/22/19   [provider]  Alirocumab (PRALUENT) 150 MG/ML SOPN Inject into the skin. Twice a month    [provider]  ALPRAZolam (XANAX) 0.5 MG tablet Take 0.5 mg by mouth 3 (three) times daily as needed. 10/26/19   [provider]  AMBULATORY NON FORMULARY MEDICATION Medication Name: Freeze Dried Aloe Vera -Take as needed for I.C.    [provider]  amLODipine (NORVASC) 2.5 MG tablet Take 2.5 mg by mouth daily.    [provider]  aspirin 81 MG tablet Take 81 mg by mouth daily.      [provider]  benazepril-hydrochlorthiazide (LOTENSIN HCT) 10-12.5 MG tablet Take 1 tablet  by mouth daily. 11/01/19   [provider]  bifidobacterium infantis (ALIGN) capsule Take 1 capsule by mouth daily. 12/30/14   Irene Shipper, MD  Calcium Glycerophosphate (PRELIEF PO) Take 1 tablet by mouth daily.    [provider]  co-enzyme Q-10 30 MG capsule Take 30 mg by mouth daily.      [provider]  diphenhydrAMINE (BENADRYL) 25 mg capsule Take by mouth.    [provider]  EPINEPHrine 0.3 mg/0.3 mL IJ SOAJ injection Inject into the muscle. 03/26/17   [provider]  fexofenadine (ALLEGRA) 180 MG tablet Take 180 mg by mouth daily.    [provider]  fluticasone (VERAMYST) 27.5 MCG/SPRAY nasal spray Place 2 sprays into the nose daily.    [provider]  levothyroxine (SYNTHROID, LEVOTHROID) 50 MCG tablet Take 50 mcg by mouth daily before breakfast.    [provider]  metoprolol (LOPRESSOR) 50 MG tablet Take 50 mg by mouth 2 (two) times daily.    [provider]  sertraline (ZOLOFT) 50 MG tablet Take 50 mg by mouth 2 (two) times daily. 09/15/19   [provider]  sodium chloride (OCEAN) 0.65 % nasal spray 1 spray as needed for congestion.    [provider]  traMADol Veatrice Bourbon) 50 MG tablet SMARTSIG:1 Tablet(s) By Mouth As Needed 05/14/19   [provider]  valACYclovir (VALTREX) 1000  MG tablet Take 1,000 mg by mouth daily as needed.     [provider]  Vitamin D, Ergocalciferol, (DRISDOL) 50000 units CAPS capsule Take 50,000 Units by mouth every 7 (seven) days.    [provider]      Allergies    Carbocaine [mepivacaine hcl], Epinephrine base, Nitrofurantoin, Albumin human, Iodine, Prolia [denosumab], Statins, and Sulfonamide derivatives    Review of Systems   Review of Systems  Physical Exam Updated Vital Signs BP (!) 154/67 (BP Location: Left Arm)   Pulse (!) 58   Temp 98.7 F (37.1 C) (Oral)   Resp 16   Ht 5' 4"$  (1.626 m)   Wt 77.1 kg   SpO2 100%    BMI 29.18 kg/m  Physical Exam Vitals and nursing note reviewed.  Constitutional:      General: She is not in acute distress.    Appearance: She is well-developed. She is not ill-appearing.  HENT:     Head: Normocephalic and atraumatic.     Nose: Nose normal.     Mouth/Throat:     Mouth: Mucous membranes are moist.  Eyes:     Extraocular Movements: Extraocular movements intact.     Conjunctiva/sclera: Conjunctivae normal.     Pupils: Pupils are equal, round, and reactive to light.  Cardiovascular:     Rate and Rhythm: Normal rate and regular rhythm.     Pulses: Normal pulses.     Heart sounds: Normal heart sounds. No murmur heard. Pulmonary:     Effort: Pulmonary effort is normal. No respiratory distress.     Breath sounds: Normal breath sounds.  Abdominal:     Palpations: Abdomen is soft.     Tenderness: There is no abdominal tenderness.  Musculoskeletal:        General: Tenderness present. Normal range of motion.     Cervical back: Normal range of motion and neck supple.     Comments: Tenderness in the right gluteal area, some tenderness in the right hip flexor area  Skin:    General: Skin is warm and dry.     Capillary Refill: Capillary refill takes less than 2 seconds.  Neurological:     General: No focal deficit present.     Mental Status: She is alert and oriented to person, place, and time.     Cranial Nerves: No cranial nerve deficit.     Sensory: No sensory deficit.     Motor: No weakness.     Coordination: Coordination normal.     ED Results / Procedures / Treatments   Labs (all labs ordered are listed, but only abnormal results are displayed) Labs Reviewed  COMPREHENSIVE METABOLIC PANEL - Abnormal; Notable for the following components:      Result Value   Sodium 131 (*)    Glucose, Bld 114 (*)    All other components within normal limits  URINALYSIS, ROUTINE W REFLEX MICROSCOPIC - Abnormal; Notable for the following components:   Leukocytes,Ua SMALL (*)     All other components within normal limits  URINALYSIS, MICROSCOPIC (REFLEX) - Abnormal; Notable for the following components:   Bacteria, UA RARE (*)    All other components within normal limits  CBC WITH DIFFERENTIAL/PLATELET  LIPASE, BLOOD    EKG None  Radiology CT Renal Stone Study  Result Date: 07/12/2022 CLINICAL DATA:  Pain. EXAM: CT ABDOMEN AND PELVIS WITHOUT CONTRAST TECHNIQUE: Multidetector CT imaging of the abdomen and pelvis was performed following the standard protocol without IV contrast. RADIATION  DOSE REDUCTION: This exam was performed according to the departmental dose-optimization program which includes automated exposure control, adjustment of the mA and/or kV according to patient size and/or use of iterative reconstruction technique. COMPARISON:  None Available. FINDINGS: Lower chest: Mild linear opacity lung bases likely scar or atelectasis. No pleural effusion. 3 mm nodule left lower lobe on image 6 of series 3. Similar focus on image 16. Hepatobiliary: Hepatic granuloma in segment 4. Slight nodular contour of the liver otherwise. The gallbladder is dilated. Pancreas: Unremarkable. No pancreatic ductal dilatation or surrounding inflammatory changes. Spleen: Normal in size without focal abnormality. Adrenals/Urinary Tract: Adrenal glands are preserved. No abnormal calcifications seen within either kidney nor along the course of either ureter. Parapelvic simple Bosniak 1 left-sided renal cyst measuring 2.3 cm in diameter and Hounsfield units of 6. Preserved contours of the urinary bladder. Stomach/Bowel: On this non oral contrast exam the large bowel is of normal course and caliber. Mild scattered colonic stool. Few colonic diverticula. Normal appendix extends posterior to the cecum in the right hemipelvis. Stomach is underdistended. Small bowel is nondilated. Vascular/Lymphatic: Normal caliber aorta and IVC with scattered mild vascular calcifications. No specific abnormal lymph  node enlargement present in the abdomen and pelvis. Reproductive: Uterus is present with some dystrophic calcifications. Calcified fibroids. No separate adnexal mass. Other: No abdominal wall hernia or abnormality. No abdominopelvic ascites. Musculoskeletal: Curvature and degenerative changes along the spine trace anterolisthesis well at L4-5 and L5-S1. Multilevel stenosis along the lumbar spine. Degenerative changes scattered about the pelvis. IMPRESSION: No bowel obstruction, free air or free fluid. Few diverticula. Normal appendix. No obstructing renal stone. Dilated gallbladder. Please correlate for any history of potential gallbladder pathology and if needed ultrasound could be performed as clinically directed. Curvature of the spine with degenerative changes. Electronically Signed   By: Jill Side M.D.   On: 07/12/2022 17:15   DG Hip Unilat With Pelvis 2-3 Views Right  Result Date: 07/12/2022 CLINICAL DATA:  Pain EXAM: DG HIP (WITH OR WITHOUT PELVIS) 2-3V RIGHT COMPARISON:  11/29/2020 FINDINGS: SI joints show mild degenerative change. Pubic symphysis and rami appear intact. No fracture or malalignment. Mild degenerative changes of the hips. IMPRESSION: Mild degenerative changes. No acute osseous abnormality. Electronically Signed   By: Donavan Foil M.D.   On: 07/12/2022 15:19    Procedures Procedures    Medications Ordered in ED Medications - No data to display  ED Course/ Medical Decision Making/ A&P                             Medical Decision Making Amount and/or Complexity of Data Reviewed Labs: ordered. Radiology: ordered.  Risk Prescription drug management.   KALASIA ESTIME is here with right hip pain.  History of the same.  Normal vitals.  No fever.  No symptoms to suggest cauda equina.  She is due for her pain injection her low back.  Denies any numbness, weakness, loss of bowel.  She had some incontinence but pain been hard for her to the bathroom.  She is  neurovascular neuromuscular intact.  She is having pain in the right glued that radiates to the right lower abdomen.  Concern for appendicitis.  Will get CBC, CMP, urinalysis, lipase, CT scan abdomen pelvis.  X-ray of the right hip per my review interpretation shows no fracture or dislocation.  Per my review interpretation labs no significant anemia or electrolyte abnormality or kidney injury.  CT scan  per radiology reports unremarkable.  Overall will prescribe Medrol Dosepak.  My suspicion is that this is a musculoskeletal issue may be sciatica, muscle spasm.  Discharged in good condition.  Understands return precautions.  This chart was dictated using voice recognition software.  Despite best efforts to proofread,  errors can occur which can change the documentation meaning.         Final Clinical Impression(s) / ED Diagnoses Final diagnoses:  Right hip pain    Rx / DC Orders ED Discharge Orders          Ordered    methylPREDNISolone (MEDROL DOSEPAK) 4 MG TBPK tablet        07/12/22 1642              Jeani Fassnacht, DO 07/12/22 1724

## 2023-06-03 ENCOUNTER — Telehealth: Payer: Self-pay | Admitting: Gastroenterology

## 2023-06-03 ENCOUNTER — Encounter: Payer: Self-pay | Admitting: Gastroenterology

## 2023-06-03 NOTE — Telephone Encounter (Signed)
 Error

## 2023-06-13 ENCOUNTER — Other Ambulatory Visit: Payer: Self-pay

## 2023-06-13 ENCOUNTER — Encounter (HOSPITAL_BASED_OUTPATIENT_CLINIC_OR_DEPARTMENT_OTHER): Payer: Self-pay | Admitting: Emergency Medicine

## 2023-06-13 ENCOUNTER — Emergency Department (HOSPITAL_BASED_OUTPATIENT_CLINIC_OR_DEPARTMENT_OTHER)
Admission: EM | Admit: 2023-06-13 | Discharge: 2023-06-13 | Disposition: A | Discharge status: Home / Self Care | Payer: Medicare HMO | Attending: Emergency Medicine | Admitting: Emergency Medicine

## 2023-06-13 DIAGNOSIS — R1013 Epigastric pain: Secondary | ICD-10-CM | POA: Insufficient documentation

## 2023-06-13 DIAGNOSIS — Z7982 Long term (current) use of aspirin: Secondary | ICD-10-CM | POA: Insufficient documentation

## 2023-06-13 DIAGNOSIS — D72829 Elevated white blood cell count, unspecified: Secondary | ICD-10-CM | POA: Insufficient documentation

## 2023-06-13 LAB — CBC
HCT: 43 % (ref 36.0–46.0)
Hemoglobin: 13.8 g/dL (ref 12.0–15.0)
MCH: 27.7 pg (ref 26.0–34.0)
MCHC: 32.1 g/dL (ref 30.0–36.0)
MCV: 86.3 fL (ref 80.0–100.0)
Platelets: 351 10*3/uL (ref 150–400)
RBC: 4.98 MIL/uL (ref 3.87–5.11)
RDW: 13.4 % (ref 11.5–15.5)
WBC: 10.8 10*3/uL — ABNORMAL HIGH (ref 4.0–10.5)
nRBC: 0 % (ref 0.0–0.2)

## 2023-06-13 LAB — COMPREHENSIVE METABOLIC PANEL
ALT: 16 U/L (ref 0–44)
AST: 17 U/L (ref 15–41)
Albumin: 4 g/dL (ref 3.5–5.0)
Alkaline Phosphatase: 76 U/L (ref 38–126)
Anion gap: 7 (ref 5–15)
BUN: 16 mg/dL (ref 8–23)
CO2: 28 mmol/L (ref 22–32)
Calcium: 9.8 mg/dL (ref 8.9–10.3)
Chloride: 102 mmol/L (ref 98–111)
Creatinine, Ser: 0.97 mg/dL (ref 0.44–1.00)
GFR, Estimated: 59 mL/min — ABNORMAL LOW (ref >60)
Glucose, Bld: 99 mg/dL (ref 70–99)
Potassium: 4.5 mmol/L (ref 3.5–5.1)
Sodium: 137 mmol/L (ref 135–145)
Total Bilirubin: 0.5 mg/dL (ref 0.0–1.2)
Total Protein: 7 g/dL (ref 6.5–8.1)

## 2023-06-13 LAB — URINALYSIS, ROUTINE W REFLEX MICROSCOPIC
Bilirubin Urine: NEGATIVE
Glucose, UA: NEGATIVE mg/dL
Hgb urine dipstick: NEGATIVE
Ketones, ur: NEGATIVE mg/dL
Nitrite: NEGATIVE
Protein, ur: NEGATIVE mg/dL
Specific Gravity, Urine: 1.015 (ref 1.005–1.030)
pH: 5.5 (ref 5.0–8.0)

## 2023-06-13 LAB — URINALYSIS, MICROSCOPIC (REFLEX): RBC / HPF: NONE SEEN RBC/hpf (ref 0–5)

## 2023-06-13 LAB — LIPASE, BLOOD: Lipase: 36 U/L (ref 11–51)

## 2023-06-13 MED ORDER — FLUCONAZOLE 150 MG PO TABS: 150.0000 mg | ORAL_TABLET | Freq: Every day | ORAL | 0 refills | Status: AC

## 2023-06-13 MED ORDER — CEPHALEXIN 500 MG PO CAPS: 500.0000 mg | ORAL_CAPSULE | Freq: Four times a day (QID) | ORAL | 0 refills | Status: DC

## 2023-06-13 MED ORDER — OMEPRAZOLE 20 MG PO CPDR: 20.0000 mg | DELAYED_RELEASE_CAPSULE | Freq: Every day | ORAL | 0 refills | Status: DC

## 2023-06-13 MED ORDER — ALUM & MAG HYDROXIDE-SIMETH 200-200-20 MG/5ML PO SUSP
30.0000 mL | Freq: Once | ORAL | Status: AC
  Administered 2023-06-13: 30 mL via ORAL
  Filled 2023-06-13: qty 30

## 2023-06-13 NOTE — ED Notes (Signed)
Pt alert and oriented X 4 at the time of discharge. RR even and unlabored. No acute distress noted. Pt verbalized understanding of discharge instructions as discussed. Pt ambulatory to lobby at time of discharge.

## 2023-06-13 NOTE — ED Provider Notes (Signed)
[facility] Provider Note   CSN: [id number] Arrival date & time: 06/13/23  1422     History  Chief Complaint  Patient presents with   Abdominal Pain    Dominique Gillespie is a 83 y.o. female who presents with acute on chronic epigastric abdominal pain.  Patient states she has had this pain for the past 4 weeks.  She has noticed increased belching and worsening of pain following eating.  Denies any vomiting or diarrhea.  No urinary symptoms.  Reports that she has tried antacids with some mild improvement.  Last colonoscopy/endoscopy was 2 years ago without significant findings.   Abdominal Pain      Home Medications Prior to Admission medications   Medication Sig Start Date End Date Taking? Authorizing Provider  cephALEXin (KEFLEX) 500 MG capsule Take 1 capsule (500 mg total) by mouth 4 (four) times daily. 06/13/23  Yes Halford Decamp, PA-C  fluconazole (DIFLUCAN) 150 MG tablet Take 1 tablet (150 mg total) by mouth daily. 06/13/23  Yes Halford Decamp, PA-C  omeprazole (PRILOSEC) 20 MG capsule Take 1 capsule (20 mg total) by mouth daily. 06/13/23  Yes Halford Decamp, PA-C  alendronate (FOSAMAX) 70 MG tablet Take 70 mg by mouth once a week. 05/22/19   [provider]  Alirocumab (PRALUENT) 150 MG/ML SOPN Inject into the skin. Twice a month    [provider]  ALPRAZolam (XANAX) 0.5 MG tablet Take 0.5 mg by mouth 3 (three) times daily as needed. 10/26/19   [provider]  AMBULATORY NON FORMULARY MEDICATION Medication Name: Freeze Dried Aloe Vera -Take as needed for I.C.    [provider]  amLODipine (NORVASC) 2.5 MG tablet Take 2.5 mg by mouth daily.    [provider]  aspirin 81 MG tablet Take 81 mg by mouth daily.      [provider]  benazepril-hydrochlorthiazide (LOTENSIN HCT) 10-12.5 MG tablet Take 1 tablet by mouth daily. 11/01/19   [provider]   bifidobacterium infantis (ALIGN) capsule Take 1 capsule by mouth daily. 12/30/14   Hilarie Fredrickson, MD  Calcium Glycerophosphate (PRELIEF PO) Take 1 tablet by mouth daily.    [provider]  co-enzyme Q-10 30 MG capsule Take 30 mg by mouth daily.      [provider]  diphenhydrAMINE (BENADRYL) 25 mg capsule Take by mouth.    [provider]  EPINEPHrine 0.3 mg/0.3 mL IJ SOAJ injection Inject into the muscle. 03/26/17   [provider]  fexofenadine (ALLEGRA) 180 MG tablet Take 180 mg by mouth daily.    [provider]  fluticasone (VERAMYST) 27.5 MCG/SPRAY nasal spray Place 2 sprays into the nose daily.    [provider]  levothyroxine (SYNTHROID, LEVOTHROID) 50 MCG tablet Take 50 mcg by mouth daily before breakfast.    [provider]  methylPREDNISolone (MEDROL DOSEPAK) 4 MG TBPK tablet Follow package insert 07/12/22   Curatolo, Adam, DO  metoprolol (LOPRESSOR) 50 MG tablet Take 50 mg by mouth 2 (two) times daily.    [provider]  sertraline (ZOLOFT) 50 MG tablet Take 50 mg by mouth 2 (two) times daily. 09/15/19   [provider]  sodium chloride (OCEAN) 0.65 % nasal spray 1 spray as needed for congestion.    [provider]  traMADol (ULTRAM) 50 MG tablet SMARTSIG:1 Tablet(s) By Mouth As Needed 05/14/19   [provider]  valACYclovir (VALTREX) 1000 MG tablet Take  1,000 mg by mouth daily as needed.     [provider]  Vitamin D, Ergocalciferol, (DRISDOL) 50000 units CAPS capsule Take 50,000 Units by mouth every 7 (seven) days.    [provider]      Allergies    Carbocaine [mepivacaine hcl], Epinephrine base, Nitrofurantoin, Albumin human, Iodine, Prolia [denosumab], Statins, and Sulfonamide derivatives    Review of Systems   Review of Systems  Gastrointestinal:  Positive for abdominal pain.    Physical Exam Updated Vital Signs BP (!) 169/46   Pulse (!) 51   Temp  97.7 F (36.5 C) (Oral)   Resp 18   Ht 5\' 3"  (1.6 m)   Wt 80.7 kg   SpO2 99%   BMI 31.53 kg/m  Physical Exam Vitals and nursing note reviewed.  Constitutional:      General: She is not in acute distress.    Appearance: She is well-developed.  HENT:     Head: Normocephalic and atraumatic.  Eyes:     Conjunctiva/sclera: Conjunctivae normal.  Cardiovascular:     Rate and Rhythm: Normal rate and regular rhythm.     Heart sounds: No murmur heard. Pulmonary:     Effort: Pulmonary effort is normal. No respiratory distress.     Breath sounds: Normal breath sounds.  Abdominal:     Palpations: Abdomen is soft.     Tenderness: There is abdominal tenderness in the epigastric area. There is no guarding or rebound.  Musculoskeletal:        General: No swelling.     Cervical back: Neck supple.  Skin:    General: Skin is warm and dry.     Capillary Refill: Capillary refill takes less than 2 seconds.  Neurological:     Mental Status: She is alert.  Psychiatric:        Mood and Affect: Mood normal.     ED Results / Procedures / Treatments   Labs (all labs ordered are listed, but only abnormal results are displayed) Labs Reviewed  COMPREHENSIVE METABOLIC PANEL - Abnormal; Notable for the following components:      Result Value   GFR, Estimated 59 (*)    All other components within normal limits  CBC - Abnormal; Notable for the following components:   WBC 10.8 (*)    All other components within normal limits  URINALYSIS, ROUTINE W REFLEX MICROSCOPIC - Abnormal; Notable for the following components:   Leukocytes,Ua MODERATE (*)    All other components within normal limits  URINALYSIS, MICROSCOPIC (REFLEX) - Abnormal; Notable for the following components:   Bacteria, UA MANY (*)    All other components within normal limits  LIPASE, BLOOD    EKG None  Radiology No results found.  Procedures Procedures    Medications Ordered in ED Medications  alum & mag  hydroxide-simeth (MAALOX/MYLANTA) 200-200-20 MG/5ML suspension 30 mL (30 mLs Oral Given 06/13/23 1655)    ED Course/ Medical Decision Making/ A&P                                 Medical Decision Making Amount and/or Complexity of Data Reviewed Labs: ordered.   This patient presents to the ED with chief complaint(s) of epigastric pain.  The complaint involves an extensive differential diagnosis and also carries with it a high risk of complications and morbidity.   pertinent past medical history as listed in HPI  The differential diagnosis includes  Cholecystitis, pancreatitis, PUD, GERD, appendicitis, diverticulitis, ACS The initial plan is to  Will obtain basic labs Additional history obtained:  Records reviewed previous admission documents and Care Everywhere/External Records  Initial Assessment:   Patient somewhat hypertensive to 160 from 47 otherwise hemodynamically stable, afebrile, nontoxic-appearing.  She is very comfortable appearing.  She does have some left upper quadrant and epigastric tenderness, otherwise abdominal exam is benign.  No urinary symptoms though UA with many bacteria and moderate leuks.  Notes correlation with symptoms and eating and some temporary improvement of symptoms with Tums.  Overall most suspicious for PUD/GERD.  She has no rebound or guarding to suggest appendicitis, no right upper quadrant tenderness to suggest cholecystitis.  Independent ECG interpretation:  None  Independent labs interpretation:  The following labs were independently interpreted:  CMP without significant abnormality, lipase unremarkable, UA with moderate leuks and many bacteria, CBC with mild leukocytosis of 10.8  Independent visualization and interpretation of imaging: None  Treatment and Reassessment: Patient given GI cocktail following first assessment Upon reassessment patient reports marked improvement of symptoms.  Discussed positive UA dispo evaluate symptoms.  Offered  patient CT abdomen pelvis, to evaluate for gallbladder or pancreatic pathology, despite concerning findings on labs or exam.  Patient declines today and states she will follow-up with her primary care doctor and a gastroenterologist.  Requests Diflucan pill for antibiotics for UTI.  Notes she has had Keflex in the past without difficulty.  Consultations obtained:   none  Disposition:   Patient discharged home.  Prescription sent in for omeprazole for presumed PUD/GERD, patient will also pick up Maalox that she had notable improvement of her symptoms tonight with that.  Prescription for Keflex sent in as well for asymptomatic UTI given age. The patient has been appropriately medically screened and/or stabilized in the ED. I have low suspicion for any other emergent medical condition which would require further screening, evaluation or treatment in the ED or require inpatient management. At time of discharge the patient is hemodynamically stable and in no acute distress. I have discussed work-up results and diagnosis with patient and answered all questions. Patient is agreeable with discharge plan. We discussed strict return precautions for returning to the emergency department and they verbalized understanding.     Social Determinants of Health:   none  This note was dictated with voice recognition software.  Despite best efforts at proofreading, errors may have occurred which can change the documentation meaning.          Final Clinical Impression(s) / ED Diagnoses Final diagnoses:  Epigastric pain    Rx / DC Orders ED Discharge Orders          Ordered    fluconazole (DIFLUCAN) 150 MG tablet  Daily        06/13/23 1830    omeprazole (PRILOSEC) 20 MG capsule  Daily        06/13/23 1830    cephALEXin (KEFLEX) 500 MG capsule  4 times daily        06/13/23 1830              Fabienne Bruns 06/13/23 1838    Tegeler, Canary Brim, MD 06/13/23 1924

## 2023-06-13 NOTE — ED Triage Notes (Signed)
Pt reports epigastric pain and cramping with "belching" and flatulence, hx of IBS, denies n/v/d, CP, ShoB

## 2023-06-13 NOTE — Discharge Instructions (Addendum)
You were evaluated in the emergency room for epigastric abdominal pain.  Your urine did suggest a UTI.  You are being given an antibiotic.  Please complete the full course of antibiotics.  You are provided a tablet of Diflucan.  As discussed a prescription for omeprazole was sent into to your pharmacy.  Please take this every day in addition to Maalox.  Please follow-up with your gastroenterologist within the next week.  If you have any new or worsening symptoms including fevers, chills, persistent vomiting and diarrhea please return to the emergency room.

## 2023-06-25 ENCOUNTER — Encounter: Payer: Self-pay | Admitting: Gastroenterology

## 2023-06-25 ENCOUNTER — Ambulatory Visit (INDEPENDENT_AMBULATORY_CARE_PROVIDER_SITE_OTHER): Payer: Medicare HMO | Admitting: Gastroenterology

## 2023-06-25 VITALS — BP 124/68 | HR 64 | Ht 64.0 in | Wt 173.8 lb

## 2023-06-25 DIAGNOSIS — K219 Gastro-esophageal reflux disease without esophagitis: Secondary | ICD-10-CM | POA: Diagnosis not present

## 2023-06-25 DIAGNOSIS — R1013 Epigastric pain: Secondary | ICD-10-CM | POA: Diagnosis not present

## 2023-06-25 MED ORDER — OMEPRAZOLE 20 MG PO CPDR: 20.0000 mg | DELAYED_RELEASE_CAPSULE | Freq: Every day | ORAL | 4 refills | Status: AC

## 2023-06-25 NOTE — Patient Instructions (Signed)
Follow up with [place] in 1 year.  _______________________________________________________  If your blood pressure at your visit was 140/90 or greater, please contact your primary care physician to follow up on this.  _______________________________________________________  If you are age 82 or older, your body mass index should be between 23-30. Your Body mass index is 29.83 kg/m. If this is out of the aforementioned range listed, please consider follow up with your Primary Care Provider.  If you are age 73 or younger, your body mass index should be between 19-25. Your Body mass index is 29.83 kg/m. If this is out of the aformentioned range listed, please consider follow up with your Primary Care Provider.   ________________________________________________________  The [facility] providers would like to encourage you to use [facility] to communicate with providers for non-urgent requests or questions.  Due to long hold times on the telephone, sending your provider a message by [facility] may be a faster and more efficient way to get a response.  Please allow 48 business hours for a response.  Please remember that this is for non-urgent requests.  _______________________________________________________  Thank you for trusting me with your gastrointestinal care!   Boone Master, PA

## 2023-06-25 NOTE — Progress Notes (Signed)
Chief Complaint: Hospital follow-up Primary GI MD: Dr. Marina Goodell  HPI: 83 year old female history of IBS and GERD presents for hospital follow-up.  Recently [facility]/13/2023 with acute on chronic epigastric pain.  She reported pain ongoing over the last 4 weeks with increased belching.  Tried antacids with mild improvement. Workup revealed incidental UTI with mild leukocytosis WBC 10.8 Unrevealing CMP  Patient was given GI cocktail with improvement.  She was offered CT scan but patient politely declined.  She was also discharged on Keflex  Patient states she is here today and is doing much better.  She notes prior to her hospital visit she had been started on Ozempic and completed 2 injections.  She noticed after the first injection she began having epigastric pain which then worsened after the second injection.  When she was in the ED and was given a GI cocktail she had instant relief.  She does take omeprazole 20 Mg once daily and follows lifestyle modifications.  She has been under a lot of stress financially lately as her husband passed away 2 years ago.  Today she notes no further symptoms and is doing well overall.    PREVIOUS GI WORKUP   Colonoscopy 11/02/2019 - One 2 mm polyp (tubular adenoma) in the ascending colon, removed with a cold snare. Resected and retrieved.  - Diverticulosis in the sigmoid colon.  - Internal hemorrhoids.  - The examination was otherwise normal on direct and retroflexion views.  EGD 09/2015 - Normal esophagus.  - Normal stomach.  - Normal examined duodenum.  - No specimens collected.  Colonoscopy 09/2015 - Five 3 to 7 mm polyps in the descending colon, in the transverse colon and in the ascending colon, removed with a cold snare. Resected and retrieved.  (4 tubular adenomas, 1 benign colonic mucosa) - Diverticulosis in the sigmoid colon.  - The examination was otherwise normal on direct and retroflexion views.  Colonoscopy  2012 -- diminutive polyp at the ileocecal valve - removed -- otherwise normal colonoscopy -- small internal hemorrhoid  Past Medical History:  Diagnosis Date   Allergy    seasonal   Anxiety    mild   Cataract    developing   Cervical (neck) region somatic dysfunction    4-5   Colon polyps    adenomatous   Fibromyalgia    GERD (gastroesophageal reflux disease)    Hemorrhoids    Hiatal hernia    Hyperlipidemia    Hypertension    Hypothyroid    IBS (irritable bowel syndrome)    Interstitial cystitis    Osteoporosis    Palpitations    Scoliosis    Spondylarthrosis    Spondylisthesis    Tachycardia    Tendonitis of foot    Tubulovillous adenoma polyp of colon    Vertigo, benign positional     Past Surgical History:  Procedure Laterality Date   COLONOSCOPY  2012   DILATION AND CURETTAGE OF UTERUS     POLYPECTOMY     TONSILLECTOMY     TUBAL LIGATION     UPPER GASTROINTESTINAL ENDOSCOPY     VEIN SURGERY     laser    Current Outpatient Medications  Medication Sig Dispense Refill   alendronate (FOSAMAX) 70 MG tablet Take 70 mg by mouth once a week.     Alirocumab (PRALUENT) 150 MG/ML SOPN Inject into the skin. Twice a month     ALPRAZolam (XANAX) 0.5 MG tablet Take 0.5 mg by mouth  3 (three) times daily as needed.     AMBULATORY NON FORMULARY MEDICATION Medication Name: Freeze Dried Aloe Vera -Take as needed for I.C.     amLODipine (NORVASC) 2.5 MG tablet Take 2.5 mg by mouth daily.     aspirin 81 MG tablet Take 81 mg by mouth daily.       benazepril-hydrochlorthiazide (LOTENSIN HCT) 10-12.5 MG tablet Take 1 tablet by mouth daily.     bifidobacterium infantis (ALIGN) capsule Take 1 capsule by mouth daily. 14 capsule 6   Calcium Glycerophosphate (PRELIEF PO) Take 1 tablet by mouth daily.     cephALEXin (KEFLEX) 500 MG capsule Take 1 capsule (500 mg total) by mouth 4 (four) times daily. 20 capsule 0   co-enzyme Q-10 30 MG capsule Take 30 mg by mouth daily.        diphenhydrAMINE (BENADRYL) 25 mg capsule Take by mouth.     EPINEPHrine 0.3 mg/0.3 mL IJ SOAJ injection Inject into the muscle.     fexofenadine (ALLEGRA) 180 MG tablet Take 180 mg by mouth daily.     fluconazole (DIFLUCAN) 150 MG tablet Take 1 tablet (150 mg total) by mouth daily. 1 tablet 0   fluticasone (VERAMYST) 27.5 MCG/SPRAY nasal spray Place 2 sprays into the nose daily.     levothyroxine (SYNTHROID, LEVOTHROID) 50 MCG tablet Take 50 mcg by mouth daily before breakfast.     methylPREDNISolone (MEDROL DOSEPAK) 4 MG TBPK tablet Follow package insert 21 each 0   metoprolol (LOPRESSOR) 50 MG tablet Take 50 mg by mouth 2 (two) times daily.     omeprazole (PRILOSEC) 20 MG capsule Take 1 capsule (20 mg total) by mouth daily. 90 capsule 4   sertraline (ZOLOFT) 50 MG tablet Take 50 mg by mouth 2 (two) times daily.     sodium chloride (OCEAN) 0.65 % nasal spray 1 spray as needed for congestion.     traMADol (ULTRAM) 50 MG tablet SMARTSIG:1 Tablet(s) By Mouth As Needed     valACYclovir (VALTREX) 1000 MG tablet Take 1,000 mg by mouth daily as needed.      Vitamin D, Ergocalciferol, (DRISDOL) 50000 units CAPS capsule Take 50,000 Units by mouth every 7 (seven) days.     Current Facility-Administered Medications  Medication Dose Route Frequency Provider Last Rate Last Admin   0.9 %  sodium chloride infusion  500 mL Intravenous Once Hilarie Fredrickson, MD        Allergies as of 06/25/2023 - Review Complete 06/25/2023  Allergen Reaction Noted   Carbocaine [mepivacaine hcl] Hives 04/23/2011   Epinephrine base Shortness Of Breath and Swelling 04/23/2011   Nitrofurantoin Rash 03/09/2016   Albumin human Other (See Comments) 07/05/2008   Iodine  03/22/2009   Prolia [denosumab]  08/25/2015   Statins  03/22/2009   Sulfonamide derivatives      Family History  Problem Relation Age of Onset   Breast cancer Mother    Heart disease Father    Ulcers Father        duodenal   Colon cancer Neg Hx     Esophageal cancer Neg Hx    Pancreatic cancer Neg Hx    Stomach cancer Neg Hx    Kidney disease Neg Hx    Liver disease Neg Hx    Diabetes Neg Hx    Colon polyps Neg Hx    Rectal cancer Neg Hx     Social History   Socioeconomic History   Marital status: Married    Spouse name:  Not on file   Number of children: 2   Years of education: Not on file   Highest education level: Not on file  Occupational History   Occupation: retired  Tobacco Use   Smoking status: Former    Current packs/day: 0.00    Types: Cigarettes    Start date: 10/23/1958    Quit date: 04/23/1959    Years since quitting: 64.2   Smokeless tobacco: Never   Tobacco comments:    x 6 months only   Vaping Use   Vaping status: Never Used  Substance and Sexual Activity   Alcohol use: Not Currently    Alcohol/week: 4.0 standard drinks of alcohol    Types: 2 Glasses of wine, 2 Shots of liquor per week   Drug use: No   Sexual activity: Not on file  Other Topics Concern   Not on file  Social History Narrative   Not on file   Social Drivers of Health   Financial Resource Strain: Low Risk  (02/04/2022)   [facility] prior to 07/27/2022., [facility] visits prior to 07/27/2022., [facility]   Overall Financial Resource Strain (CARDIA)    Difficulty of Paying Living Expenses: Not very hard  Food Insecurity: Low Risk  (11/20/2022)   Received from [facility]   Hunger Vital Sign    Worried About Running Out of Food in the Last Year: Never true    Ran Out of Food in the Last Year: Never true  Transportation Needs: Not on file (11/20/2022)  Physical Activity: Unknown (02/04/2022)   [facility], [facility] prior to 07/27/2022., [facility] visits prior to 07/27/2022., [facility] visits prior to 07/27/2022., [facility]   Exercise Vital Sign    Days of  Exercise per Week: 0 days    Minutes of Exercise per Session: Not on file  Stress: Stress Concern Present (02/04/2022)   [facility], [facility] prior to 07/27/2022., [facility] visits prior to 07/27/2022., [facility]   [employer] of Occupational Health - Occupational Stress Questionnaire    Feeling of Stress : Rather much  Social Connections: Moderately Integrated (02/04/2022)   Received from [facility] visits prior to 07/27/2022., [facility], [facility] visits prior to 07/27/2022., [facility]   Social Connection and Isolation Panel [NHANES]    Frequency of Communication with Friends and Family: More than three times a week    Frequency of Social Gatherings with Friends and Family: Twice a week    Attends Religious Services: More than 4 times per year    Active Member of [employer] or Organizations: Yes    Attends Banker Meetings: More than 4 times per year    Marital Status: Widowed  Intimate Partner Violence: Not At Risk (02/04/2022)   Received from [facility] visits prior to 07/27/2022., [facility] visits prior to 07/27/2022.   Humiliation, Afraid, Rape, and Kick questionnaire    Fear of Current or Ex-Partner: No    Emotionally Abused: No    Physically Abused: No    Sexually Abused: No    Review of Systems:    Constitutional: No weight loss, fever, chills, weakness or fatigue HEENT: Eyes: No change in vision  Ears, Nose, Throat:  No change in hearing or congestion Skin: No rash or itching Cardiovascular: No chest pain, chest pressure or palpitations   Respiratory: No SOB or cough Gastrointestinal: See HPI and otherwise negative Genitourinary: No dysuria or change in urinary frequency Neurological: No headache, dizziness or syncope Musculoskeletal: No new muscle or joint pain Hematologic: No  bleeding or bruising Psychiatric: No history of depression or anxiety    Physical Exam:  Vital signs: BP 124/68   Pulse 64   Ht 5\' 4"  (1.626 m)   Wt 173 lb 12.8 oz (78.8 kg)   SpO2 98%   BMI 29.83 kg/m   Constitutional: NAD, Well developed, Well nourished, alert and cooperative Head:  Normocephalic and atraumatic. Eyes:   PEERL, EOMI. No icterus. Conjunctiva pink. Respiratory: Respirations even and unlabored. Lungs clear to auscultation bilaterally.   No wheezes, crackles, or rhonchi.  Cardiovascular:  Regular rate and rhythm. No peripheral edema, cyanosis or pallor.  Gastrointestinal:  Soft, nondistended, nontender. No rebound or guarding. Normal bowel sounds. No appreciable masses or hepatomegaly. Rectal:  Not performed.  Msk:  Symmetrical without gross deformities. Without edema, no deformity or joint abnormality.  Neurologic:  Alert and  oriented x4;  grossly normal neurologically.  Skin:   Dry and intact without significant lesions or rashes. Psychiatric: Oriented to person, place and time. Demonstrates good judgement and reason without abnormal affect or behaviors.   RELEVANT LABS AND IMAGING: CBC    Component Value Date/Time   WBC 10.8 (H) 06/13/2023 1444   RBC 4.98 06/13/2023 1444   HGB 13.8 06/13/2023 1444   HCT 43.0 06/13/2023 1444   PLT 351 06/13/2023 1444   MCV 86.3 06/13/2023 1444   MCH 27.7 06/13/2023 1444   MCHC 32.1 06/13/2023 1444   RDW 13.4 06/13/2023 1444   LYMPHSABS 1.2 07/12/2022 1535   MONOABS 0.5 07/12/2022 1535   EOSABS 0.3 07/12/2022 1535   BASOSABS 0.1 07/12/2022 1535    CMP     Component Value Date/Time   NA 137 06/13/2023 1444   K 4.5 06/13/2023 1444   CL 102 06/13/2023 1444   CO2 28 06/13/2023 1444   GLUCOSE 99 06/13/2023 1444   BUN 16 06/13/2023 1444   CREATININE 0.97 06/13/2023 1444   CALCIUM 9.8 06/13/2023 1444   PROT 7.0 06/13/2023 1444   ALBUMIN 4.0 06/13/2023 1444   AST 17 06/13/2023 1444   ALT 16 06/13/2023 1444    ALKPHOS 76 06/13/2023 1444   BILITOT 0.5 06/13/2023 1444   GFRNONAA 59 (L) 06/13/2023 1444   GFRAA >60 05/20/2015 0010     Assessment/Plan:   Epigastric pain GERD Flareup of epigastric pain/GERD after Ozempic which resolved with GI cocktail and diet modification.  No further symptoms since stopping Ozempic.  Currently well-controlled on omeprazole 20 Mg once daily.  EGD in 2017 unrevealing.  No CT was pursued. - Reassurance - Continue omeprazole 20 Mg once daily - Educated patient on side effects of Ozempic including GERD, decreased GI motility, cholelithiasis, and pancreatitis.  Patient was on Ozempic for weight loss and has discontinued it. - Follow-up 1 year for chronic GERD  Vlasta Baskin Jolee Ewing [facility] 06/25/2023, 12:36 PM  Cc: Laqueta Due., MD

## 2023-06-25 NOTE — Progress Notes (Signed)
Assessment and plan noted ?

## 2023-11-21 ENCOUNTER — Ambulatory Visit (INDEPENDENT_AMBULATORY_CARE_PROVIDER_SITE_OTHER): Admitting: Internal Medicine

## 2023-11-21 ENCOUNTER — Encounter: Payer: Self-pay | Admitting: Internal Medicine

## 2023-11-21 VITALS — BP 116/60 | HR 60 | Ht 61.75 in | Wt 171.1 lb

## 2023-11-21 DIAGNOSIS — R142 Eructation: Secondary | ICD-10-CM

## 2023-11-21 DIAGNOSIS — R194 Change in bowel habit: Secondary | ICD-10-CM | POA: Diagnosis not present

## 2023-11-21 DIAGNOSIS — K648 Other hemorrhoids: Secondary | ICD-10-CM

## 2023-11-21 DIAGNOSIS — R1013 Epigastric pain: Secondary | ICD-10-CM

## 2023-11-21 DIAGNOSIS — K219 Gastro-esophageal reflux disease without esophagitis: Secondary | ICD-10-CM | POA: Diagnosis not present

## 2023-11-21 NOTE — Patient Instructions (Addendum)
 Take 2 tablespoons of Citrucel in water or juice daily.  Continue to use Preparation H.  _______________________________________________________  If your blood pressure at your visit was 140/90 or greater, please contact your primary care physician to follow up on this.  _______________________________________________________  If you are age 82 or older, your body mass index should be between 23-30. Your Body mass index is 31.55 kg/m. If this is out of the aforementioned range listed, please consider follow up with your Primary Care Provider.  If you are age 71 or younger, your body mass index should be between 19-25. Your Body mass index is 31.55 kg/m. If this is out of the aformentioned range listed, please consider follow up with your Primary Care Provider.   ________________________________________________________  The [facility] providers would like to encourage you to use MYCHART to communicate with providers for non-urgent requests or questions.  Due to long hold times on the telephone, sending your provider a message by [facility] may be a faster and more efficient way to get a response.  Please allow 48 business hours for a response.  Please remember that this is for non-urgent requests.  ______________________________________________________

## 2023-11-24 ENCOUNTER — Encounter: Payer: Self-pay | Admitting: Internal Medicine

## 2023-11-24 NOTE — Progress Notes (Signed)
 HISTORY OF PRESENT ILLNESS:  Dominique Gillespie is a 83 y.o. female with past medical history as listed below presents today regarding complaints of increased intestinal gas with belching, alternating bowel habits, and symptomatic hemorrhoids.  The patient was last seen in this office June 25, 2023 regarding GERD and possible Ozempic related side effects.  See education.  She presents today with complaints of increased intestinal gas and belching.  Also describes her bowel habits as alternating between pellet formations and loose stools.  Finally, she mentions treating hemorrhoids.  Preparation H does help.  Blood work from January reviewed.  Unremarkable including hemoglobin 13.8.  Last complete colonoscopy 2021 with diminutive polyp and diverticulosis as well as internal hemorrhoids.  Last upper endoscopy 2017.  This was normal.  Patient denies GI bleeding.  No weight loss.  Sorry to hear that her husband Dominique Gillespie passed away.  REVIEW OF SYSTEMS:  All non-GI ROS negative. Past Medical History:  Diagnosis Date   Allergy    seasonal   Anxiety    mild   Cataract    developing   Cervical (neck) region somatic dysfunction    4-5   Colon polyps    adenomatous   Fibromyalgia    GERD (gastroesophageal reflux disease)    Hemorrhoids    Hiatal hernia    Hyperlipidemia    Hypertension    Hypothyroid    IBS (irritable bowel syndrome)    Interstitial cystitis    Osteoporosis    Palpitations    Scoliosis    Spondylarthrosis    Spondylisthesis    Tachycardia    Tendonitis of foot    Tubulovillous adenoma polyp of colon    Vertigo, benign positional     Past Surgical History:  Procedure Laterality Date   COLONOSCOPY  2012   DILATION AND CURETTAGE OF UTERUS     POLYPECTOMY     TONSILLECTOMY     TUBAL LIGATION     UPPER GASTROINTESTINAL ENDOSCOPY     VEIN SURGERY     laser    Social History Dominique Gillespie  reports that she quit smoking about 64 years ago. Her smoking use  included cigarettes. She started smoking about 65 years ago. She has never used smokeless tobacco. She reports that she does not currently use alcohol after a past usage of about 4.0 standard drinks of alcohol per week. She reports that she does not use drugs.  family history includes Breast cancer in her mother; Heart disease in her father; Ulcers in her father.  Allergies  Allergen Reactions   Carbocaine [Mepivacaine Hcl] Hives   Epinephrine Base Shortness Of Breath and Swelling   Nitrofurantoin  Rash    Furantoin for UTI - 1960's.   Albumin Human Other (See Comments)    unknown   Iodine     IV contrast   Prolia [Denosumab]     Pitting edema   Semaglutide     Other Reaction(s): Unknown   Statins    Sulfamethoxazole-Trimethoprim     Other Reaction(s): Unknown   Sulfonamide Derivatives        PHYSICAL EXAMINATION: Vital signs: BP 116/60 (BP Location: Left Arm, Patient Position: Sitting, Cuff Size: Large)   Pulse 60   Ht 5' 1.75 (1.568 m) Comment: height measured without shoes  Wt 171 lb 2 oz (77.6 kg)   BMI 31.55 kg/m   Constitutional: generally well-appearing, no acute distress Psychiatric: alert and oriented x3, cooperative Eyes: extraocular movements intact, anicteric, conjunctiva pink Mouth: oral pharynx  moist, no lesions Neck: supple no lymphadenopathy Cardiovascular: heart regular rate and rhythm, no murmur Lungs: clear to auscultation bilaterally Abdomen: soft, nontender, nondistended, no obvious ascites, no peritoneal signs, normal bowel sounds, no organomegaly Rectal: Omitted Extremities: no clubbing, cyanosis, or lower extremity edema bilaterally Skin: no lesions on visible extremities Neuro: No focal deficits.  Cranial nerves intact  ASSESSMENT:  1.  Dyspepsia with gas and belching 2.  GERD 3.  Altered bowel habits 4.  Symptomatic hemorrhoids 5.  General Medical problems   PLAN:  1.  Reflux precautions 2.  Weight loss 3.  Continue omeprazole  4.   Citrucel 2 tablespoons daily for alternating bowel habits 5.  Continue Preparation H for hemorrhoids 6.  Discussion on hemorrhoidal banding procedure.  Brochure provided 7.  Routine office follow-up in 6 to 8 weeks.  Reassess the above issues. A total time of 40 minutes was spent preparing to see the patient, obtaining interval history, performing medically appropriate physical exam, counseling and educating the patient regarding the above listed issues, directing medical therapies, defining follow-up interval, and documenting clinical information in the health record

## 2024-01-14 ENCOUNTER — Encounter: Payer: Self-pay | Admitting: Internal Medicine

## 2024-01-14 ENCOUNTER — Ambulatory Visit (INDEPENDENT_AMBULATORY_CARE_PROVIDER_SITE_OTHER): Admitting: Internal Medicine

## 2024-01-14 VITALS — BP 130/72 | HR 62 | Ht 61.75 in | Wt 173.0 lb

## 2024-01-14 DIAGNOSIS — R194 Change in bowel habit: Secondary | ICD-10-CM | POA: Diagnosis not present

## 2024-01-14 DIAGNOSIS — K648 Other hemorrhoids: Secondary | ICD-10-CM

## 2024-01-14 DIAGNOSIS — K649 Unspecified hemorrhoids: Secondary | ICD-10-CM

## 2024-01-14 DIAGNOSIS — K219 Gastro-esophageal reflux disease without esophagitis: Secondary | ICD-10-CM

## 2024-01-14 NOTE — Patient Instructions (Signed)
 Please follow up as needed.  _______________________________________________________  If your blood pressure at your visit was 140/90 or greater, please contact your primary care physician to follow up on this.  _______________________________________________________  If you are age 82 or older, your body mass index should be between 23-30. Your Body mass index is 31.9 kg/m. If this is out of the aforementioned range listed, please consider follow up with your Primary Care Provider.  If you are age 68 or younger, your body mass index should be between 19-25. Your Body mass index is 31.9 kg/m. If this is out of the aformentioned range listed, please consider follow up with your Primary Care Provider.   ________________________________________________________  The [facility] providers would like to encourage you to use MYCHART to communicate with providers for non-urgent requests or questions.  Due to long hold times on the telephone, sending your provider a message by [facility] may be a faster and more efficient way to get a response.  Please allow 48 business hours for a response.  Please remember that this is for non-urgent requests.  _______________________________________________________  [facility] is using a team-based approach to care.  Your team is made up of your doctor and two to three APPS. Our APPS (Nurse Practitioners and Physician Assistants) work with your physician to ensure care continuity for you. They are fully qualified to address your health concerns and develop a treatment plan. They communicate directly with your gastroenterologist to care for you. Seeing the Advanced Practice Practitioners on your physician's team can help you by facilitating care more promptly, often allowing for earlier appointments, access to diagnostic testing, procedures, and other specialty referrals.

## 2024-01-14 NOTE — Progress Notes (Signed)
 HISTORY OF PRESENT ILLNESS:  Dominique Gillespie is a 83 y.o. female, wife of Dominique Gillespie, with past medical history as listed below who presents today for GI follow-up.  Last seen November 21, 2023 regarding dyspepsia, GERD, altered bowel habits, and symptomatic hemorrhoids.  See that dictation.  She was advised with regards to reflux precautions, continued on omeprazole , told to initiate Citrucel, and continue Preparation H.  We also discussed hemorrhoidal banding.  She presents today for follow-up.  She reports significant improvement in previous issues.  She has been compliant with fiber therapy and reports that her bowel habits have normalized.  She has been using Preparation H as well as a unidentified over-the-counter agent with vasoconstricting properties.  This has helped.  Reflux is stable on medical therapy.  No new GI complaints.  She is having trouble with her back.  REVIEW OF SYSTEMS:  All non-GI ROS negative except for back pain, dental issues,  Past Medical History:  Diagnosis Date   Allergy    seasonal   Anxiety    mild   Cataract    developing   Cervical (neck) region somatic dysfunction    4-5   Colon polyps    adenomatous   Fibromyalgia    GERD (gastroesophageal reflux disease)    Hemorrhoids    Hiatal hernia    Hyperlipidemia    Hypertension    Hypothyroid    IBS (irritable bowel syndrome)    Interstitial cystitis    Osteoporosis    Palpitations    Scoliosis    Spondylarthrosis    Spondylisthesis    Tachycardia    Tendonitis of foot    Tubulovillous adenoma polyp of colon    Vertigo, benign positional     Past Surgical History:  Procedure Laterality Date   COLONOSCOPY  2012   DILATION AND CURETTAGE OF UTERUS     POLYPECTOMY     TONSILLECTOMY     TUBAL LIGATION     UPPER GASTROINTESTINAL ENDOSCOPY     VEIN SURGERY     laser    Social History Dominique Gillespie  reports that she quit smoking about 64 years ago. Her smoking use included cigarettes. She  started smoking about 65 years ago. She has never used smokeless tobacco. She reports that she does not currently use alcohol after a past usage of about 4.0 standard drinks of alcohol per week. She reports that she does not use drugs.  family history includes Breast cancer in her mother; Heart disease in her father; Ulcers in her father.  Allergies  Allergen Reactions   Carbocaine [Mepivacaine Hcl] Hives   Epinephrine Base Shortness Of Breath and Swelling   Nitrofurantoin  Rash    Furantoin for UTI - 1960's.   Albumin Human Other (See Comments)    unknown   Iodine     IV contrast   Prolia [Denosumab]     Pitting edema   Semaglutide     Other Reaction(s): Unknown   Statins    Sulfamethoxazole-Trimethoprim     Other Reaction(s): Unknown   Sulfonamide Derivatives        PHYSICAL EXAMINATION: Vital signs: BP 130/72   Pulse 62   Ht 5' 1.75 (1.568 m)   Wt 173 lb (78.5 kg)   BMI 31.90 kg/m  General: Well-developed, well-nourished, no acute distress HEENT: Anicteric Abdomen: Not reexamined. Extremities: No abnormalities on visible extremities Psychiatric: alert and oriented x3. Cooperative   ASSESSMENT:  1.  Altered bowel habits.  Significantly improved with daily  fiber supplementation 2.  GERD.  Stable on omeprazole  3.  Symptomatic hemorrhoids.  Issues improved with current therapies   PLAN:  1.  Continue Citrucel 2 tablespoons daily 2.  Reflux precautions 3.  Continue omeprazole  4.  Could consider hemorrhoidal banding procedure if the patient has significant recurrent hemorrhoidal symptoms refractory to current therapies. 5.  Routine office follow-up 1 year.  Sooner if needed
